# Patient Record
Sex: Female | Born: 1970 | Race: White | Hispanic: No | Marital: Married | Smoking: Never smoker
Health system: Southern US, Community
[De-identification: ages and names within clinical notes are randomized; demographics above are authoritative.]

## PROBLEM LIST (undated history)

## (undated) DIAGNOSIS — N83209 Unspecified ovarian cyst, unspecified side: Secondary | ICD-10-CM

## (undated) DIAGNOSIS — N200 Calculus of kidney: Secondary | ICD-10-CM

## (undated) DIAGNOSIS — F329 Major depressive disorder, single episode, unspecified: Secondary | ICD-10-CM

## (undated) DIAGNOSIS — E876 Hypokalemia: Secondary | ICD-10-CM

## (undated) DIAGNOSIS — C569 Malignant neoplasm of unspecified ovary: Secondary | ICD-10-CM

## (undated) DIAGNOSIS — E785 Hyperlipidemia, unspecified: Secondary | ICD-10-CM

## (undated) DIAGNOSIS — F32A Depression, unspecified: Secondary | ICD-10-CM

## (undated) HISTORY — PX: LITHOTRIPSY: SUR834

## (undated) HISTORY — PX: ABDOMINAL SURGERY: SHX537

## (undated) HISTORY — PX: KIDNEY SURGERY: SHX687

---

## 2009-09-08 ENCOUNTER — Emergency Department (HOSPITAL_BASED_OUTPATIENT_CLINIC_OR_DEPARTMENT_OTHER): Admission: EM | Admit: 2009-09-08 | Discharge: 2009-09-08 | Payer: Self-pay | Admitting: Emergency Medicine

## 2010-12-20 LAB — BASIC METABOLIC PANEL
BUN: 20 mg/dL (ref 6–23)
CO2: 23 mEq/L (ref 19–32)
Calcium: 8.8 mg/dL (ref 8.4–10.5)
Chloride: 103 mEq/L (ref 96–112)
Creatinine, Ser: 0.7 mg/dL (ref 0.4–1.2)
GFR calc Af Amer: >60 mL/min (ref >60)
GFR calc non Af Amer: >60 mL/min (ref >60)
Glucose, Bld: 202 mg/dL — ABNORMAL HIGH (ref 70–99)
Potassium: 3.7 mEq/L (ref 3.5–5.1)
Sodium: 141 mEq/L (ref 135–145)

## 2012-04-29 ENCOUNTER — Emergency Department (INDEPENDENT_AMBULATORY_CARE_PROVIDER_SITE_OTHER): Payer: 59

## 2012-04-29 ENCOUNTER — Emergency Department
Admission: EM | Admit: 2012-04-29 | Discharge: 2012-04-29 | Disposition: A | Discharge status: Home / Self Care | Payer: 59 | Source: Home / Self Care

## 2012-04-29 DIAGNOSIS — M25579 Pain in unspecified ankle and joints of unspecified foot: Secondary | ICD-10-CM

## 2012-04-29 DIAGNOSIS — M79609 Pain in unspecified limb: Secondary | ICD-10-CM

## 2012-04-29 DIAGNOSIS — S93409A Sprain of unspecified ligament of unspecified ankle, initial encounter: Secondary | ICD-10-CM

## 2012-04-29 DIAGNOSIS — M7989 Other specified soft tissue disorders: Secondary | ICD-10-CM

## 2012-04-29 HISTORY — DX: Depression, unspecified: F32.A

## 2012-04-29 HISTORY — DX: Hyperlipidemia, unspecified: E78.5

## 2012-04-29 HISTORY — DX: Major depressive disorder, single episode, unspecified: F32.9

## 2012-04-29 HISTORY — DX: Calculus of kidney: N20.0

## 2012-04-29 NOTE — ED Provider Notes (Signed)
History     CSN: [id number]  Arrival date & time 04/29/12  1111   First MD Initiated Contact with Patient 04/29/12 1116      Chief Complaint  Patient presents with  . Foot Pain   Patient is a 41 y.o. female presenting with lower extremity pain. The history is provided by the patient.  Foot Pain This is a new problem. The current episode started yesterday (Pt was walking and accidentally rolled ankle). The problem occurs constantly. The symptoms are aggravated by walking (plantar flexion ). The symptoms are relieved by ice, NSAIDs and rest.    Past Medical History  Diagnosis Date  . Diabetes mellitus   . Kidney stones   . Hyperlipidemia   . Depression     Past Surgical History  Procedure Date  . Lithotripsy     91- kidney stone sx    Family History  Problem Relation Age of Onset  . Atrial fibrillation Mother     History  Substance Use Topics  . Smoking status: Never Smoker   . Smokeless tobacco: Not on file  . Alcohol Use: No    OB History    Grav Para Term Preterm Abortions TAB SAB Ect Mult Living                  Review of Systems  All other systems reviewed and are negative.    Allergies  Morphine and related  Home Medications   Current Outpatient Rx  Name Route Sig Dispense Refill  . ASPIRIN 81 MG PO TABS Oral Take 81 mg by mouth daily.    Marland Kitchen CITALOPRAM HYDROBROMIDE 20 MG PO TABS Oral Take 20 mg by mouth daily.    Marland Kitchen CITALOPRAM HYDROBROMIDE 40 MG PO TABS Oral Take 40 mg by mouth daily.    Marland Kitchen GLIPIZIDE 5 MG PO TABS Oral Take 5 mg by mouth 2 (two) times daily before a meal.    . INDAPAMIDE 2.5 MG PO TABS Oral Take 2.5 mg by mouth every morning.    Marland Kitchen LISINOPRIL 5 MG PO TABS Oral Take 5 mg by mouth daily.    Marland Kitchen METFORMIN HCL 1000 MG PO TABS Oral Take 1,000 mg by mouth 2 (two) times daily with a meal.      BP 118/76  Pulse 67  Temp 98.2 F (36.8 C) (Oral)  Resp 18  Ht 5\' 5"  (1.651 m)  Wt 226 lb (102.513 kg)  BMI 37.61 kg/m2  SpO2 98%  LMP  04/19/2012  Physical Exam  Constitutional: She appears well-developed and well-nourished.  HENT:  Head: Normocephalic and atraumatic.  Eyes: Conjunctivae are normal. Pupils are equal, round, and reactive to light.  Neck: Normal range of motion. Neck supple.  Cardiovascular: Normal rate and regular rhythm.   Pulmonary/Chest: Effort normal and breath sounds normal.  Abdominal: Soft.  Musculoskeletal:       Feet:    ED Course  Procedures (including critical care time)  Labs Reviewed - No data to display No results found.   1. Ankle sprain       MDM  Ankle brace placed.  RICE and NSAIDs.  Discussed general red flags for reevaluation.  Follow up as needed.     The patient and/or caregiver has been counseled thoroughly with regard to treatment plan and/or medications prescribed including dosage, schedule, interactions, rationale for use, and possible side effects and they verbalize understanding. Diagnoses and expected course of recovery discussed and will return if not improved as expected  or if the condition worsens. Patient and/or caregiver verbalized understanding.             Floydene Flock, MD 04/29/12 1300

## 2012-04-29 NOTE — ED Notes (Signed)
Hit left foot on Friday and heard a pop, now having pain to top and bottom of foot.

## 2012-05-02 NOTE — ED Provider Notes (Signed)
Agree with exam, assessment, and plan.   Lattie Haw, MD 05/02/12 [phone]

## 2013-04-26 ENCOUNTER — Encounter (HOSPITAL_BASED_OUTPATIENT_CLINIC_OR_DEPARTMENT_OTHER): Payer: Self-pay | Admitting: *Deleted

## 2013-04-26 ENCOUNTER — Emergency Department (HOSPITAL_BASED_OUTPATIENT_CLINIC_OR_DEPARTMENT_OTHER)
Admission: EM | Admit: 2013-04-26 | Discharge: 2013-04-26 | Disposition: A | Discharge status: Home / Self Care | Payer: Managed Care, Other (non HMO) | Attending: Emergency Medicine | Admitting: Emergency Medicine

## 2013-04-26 DIAGNOSIS — E119 Type 2 diabetes mellitus without complications: Secondary | ICD-10-CM | POA: Insufficient documentation

## 2013-04-26 DIAGNOSIS — Z87442 Personal history of urinary calculi: Secondary | ICD-10-CM | POA: Insufficient documentation

## 2013-04-26 DIAGNOSIS — T368X5A Adverse effect of other systemic antibiotics, initial encounter: Secondary | ICD-10-CM | POA: Insufficient documentation

## 2013-04-26 DIAGNOSIS — R350 Frequency of micturition: Secondary | ICD-10-CM | POA: Insufficient documentation

## 2013-04-26 DIAGNOSIS — F3289 Other specified depressive episodes: Secondary | ICD-10-CM | POA: Insufficient documentation

## 2013-04-26 DIAGNOSIS — Z8639 Personal history of other endocrine, nutritional and metabolic disease: Secondary | ICD-10-CM | POA: Insufficient documentation

## 2013-04-26 DIAGNOSIS — Z3202 Encounter for pregnancy test, result negative: Secondary | ICD-10-CM | POA: Insufficient documentation

## 2013-04-26 DIAGNOSIS — F329 Major depressive disorder, single episode, unspecified: Secondary | ICD-10-CM | POA: Insufficient documentation

## 2013-04-26 DIAGNOSIS — Z862 Personal history of diseases of the blood and blood-forming organs and certain disorders involving the immune mechanism: Secondary | ICD-10-CM | POA: Insufficient documentation

## 2013-04-26 DIAGNOSIS — Z7982 Long term (current) use of aspirin: Secondary | ICD-10-CM | POA: Insufficient documentation

## 2013-04-26 DIAGNOSIS — N390 Urinary tract infection, site not specified: Secondary | ICD-10-CM | POA: Insufficient documentation

## 2013-04-26 DIAGNOSIS — R3911 Hesitancy of micturition: Secondary | ICD-10-CM | POA: Insufficient documentation

## 2013-04-26 DIAGNOSIS — Z79899 Other long term (current) drug therapy: Secondary | ICD-10-CM | POA: Insufficient documentation

## 2013-04-26 DIAGNOSIS — T7840XA Allergy, unspecified, initial encounter: Secondary | ICD-10-CM

## 2013-04-26 LAB — URINALYSIS, ROUTINE W REFLEX MICROSCOPIC
Bilirubin Urine: NEGATIVE
Glucose, UA: NEGATIVE mg/dL
Ketones, ur: NEGATIVE mg/dL
Nitrite: NEGATIVE
Protein, ur: NEGATIVE mg/dL
Specific Gravity, Urine: 1.016 (ref 1.005–1.030)
Urobilinogen, UA: 0.2 mg/dL (ref 0.0–1.0)
pH: 6.5 (ref 5.0–8.0)

## 2013-04-26 LAB — URINE MICROSCOPIC-ADD ON

## 2013-04-26 LAB — PREGNANCY, URINE: Preg Test, Ur: NEGATIVE

## 2013-04-26 MED ORDER — PREDNISONE 20 MG PO TABS: ORAL_TABLET | ORAL | Status: DC

## 2013-04-26 MED ORDER — DIPHENHYDRAMINE HCL 50 MG/ML IJ SOLN
25.0000 mg | Freq: Once | INTRAMUSCULAR | Status: AC
  Administered 2013-04-26: 25 mg via INTRAMUSCULAR
  Filled 2013-04-26: qty 1

## 2013-04-26 MED ORDER — FAMOTIDINE 20 MG PO TABS: 20.0000 mg | ORAL_TABLET | Freq: Two times a day (BID) | ORAL | Status: DC

## 2013-04-26 MED ORDER — DEXAMETHASONE SODIUM PHOSPHATE 10 MG/ML IJ SOLN
10.0000 mg | Freq: Once | INTRAMUSCULAR | Status: AC
  Administered 2013-04-26: 10 mg via INTRAMUSCULAR
  Filled 2013-04-26: qty 1

## 2013-04-26 MED ORDER — NITROFURANTOIN MONOHYD MACRO 100 MG PO CAPS: 100.0000 mg | ORAL_CAPSULE | Freq: Two times a day (BID) | ORAL | Status: DC

## 2013-04-26 MED ORDER — FAMOTIDINE 20 MG PO TABS
40.0000 mg | ORAL_TABLET | Freq: Once | ORAL | Status: AC
  Administered 2013-04-26: 40 mg via ORAL
  Filled 2013-04-26: qty 2

## 2013-04-26 MED ORDER — LORATADINE 10 MG PO TABS
10.0000 mg | ORAL_TABLET | Freq: Once | ORAL | Status: DC
  Filled 2013-04-26: qty 1

## 2013-04-26 NOTE — ED Notes (Signed)
Rx x 3 for macrobid, prednisone and pepcid. Pt's husband is picking her up

## 2013-04-26 NOTE — ED Notes (Signed)
MD at bedside. 

## 2013-04-26 NOTE — ED Provider Notes (Signed)
CSN: [id number]     Arrival date & time 04/26/13  6440 History     First MD Initiated Contact with Patient 04/26/13 [phone]     Chief Complaint  Patient presents with  . Dysuria   (Consider location/radiation/quality/duration/timing/severity/associated sxs/prior Treatment) Patient is a 42 y.o. female presenting with dysuria. The history is provided by the patient.  Dysuria Pain quality:  Burning Pain severity:  Moderate Onset quality:  Sudden Duration:  12 hours Timing:  Constant Progression:  Unchanged Chronicity:  Recurrent Recent urinary tract infections: yes   Relieved by:  Nothing Worsened by:  Nothing tried Ineffective treatments: old bactrim. Urinary symptoms: frequent urination and hesitancy   Associated symptoms: no abdominal pain and no flank pain   Risk factors: recurrent urinary tract infections   Also having itching in the mouth since taking the old bactrim tablets.  No swelling of the lips tongue or uvula.  No rashes on the skin no SOB  Past Medical History  Diagnosis Date  . Diabetes mellitus   . Kidney stones   . Hyperlipidemia   . Depression    Past Surgical History  Procedure Laterality Date  . Lithotripsy      89- kidney stone sx   Family History  Problem Relation Age of Onset  . Atrial fibrillation Mother    History  Substance Use Topics  . Smoking status: Never Smoker   . Smokeless tobacco: Not on file  . Alcohol Use: No   OB History   Grav Para Term Preterm Abortions TAB SAB Ect Mult Living                 Review of Systems  HENT: Negative for sore throat, drooling, mouth sores, trouble swallowing and voice change.   Gastrointestinal: Negative for abdominal pain.  Genitourinary: Positive for dysuria. Negative for flank pain.  Skin: Negative for rash.  All other systems reviewed and are negative.    Allergies  Morphine and related  Home Medications   Current Outpatient Rx  Name  Route  Sig  Dispense  Refill  . aspirin 81 MG  tablet   Oral   Take 81 mg by mouth daily.         . citalopram (CELEXA) 20 MG tablet   Oral   Take 20 mg by mouth daily.         . citalopram (CELEXA) 40 MG tablet   Oral   Take 40 mg by mouth daily.         Marland Kitchen glipiZIDE (GLUCOTROL) 5 MG tablet   Oral   Take 5 mg by mouth 2 (two) times daily before a meal.         . indapamide (LOZOL) 2.5 MG tablet   Oral   Take 2.5 mg by mouth every morning.         Marland Kitchen lisinopril (PRINIVIL,ZESTRIL) 5 MG tablet   Oral   Take 5 mg by mouth daily.         . metFORMIN (GLUCOPHAGE) 1000 MG tablet   Oral   Take 1,000 mg by mouth 2 (two) times daily with a meal.          BP 132/85  Pulse 74  Temp(Src) 98.3 F (36.8 C) (Oral)  Resp 18  Ht 5\' 5"  (1.651 m)  Wt 212 lb (96.163 kg)  BMI 35.28 kg/m2  SpO2 98%  LMP 04/19/2013 Physical Exam  Constitutional: She is oriented to person, place, and time. She appears well-developed and well-nourished.  No distress.  HENT:  Head: Normocephalic and atraumatic.  Mouth/Throat: Oropharynx is clear and moist.  No swelling of the lips tongue or uvula  Eyes: Conjunctivae are normal.  Neck: Normal range of motion. Neck supple.  Cardiovascular: Normal rate and regular rhythm.   Pulmonary/Chest: Effort normal and breath sounds normal. No stridor. She has no wheezes. She has no rales.  Abdominal: Soft. Bowel sounds are normal. There is no tenderness. There is no rebound and no guarding.  Musculoskeletal: Normal range of motion.  Neurological: She is alert and oriented to person, place, and time.  Skin: Skin is warm and dry. No rash noted.  Psychiatric: She has a normal mood and affect.    ED Course   Procedures (including critical care time)  Labs Reviewed  URINALYSIS, ROUTINE W REFLEX MICROSCOPIC  PREGNANCY, URINE   No results found. No diagnosis found.  MDM  Patient markedly improved post medication.  Discontinue bactrim list as allergy.  Will prescribe steroids and pepcid.  Will  also prescribe macrobid.    Jasmine Awe, MD 04/26/13 [phone]

## 2013-04-26 NOTE — ED Notes (Signed)
States believes she has UTI so took a bactrim- after taking bactrim she began having itching and tingling in her mouth

## 2013-04-28 LAB — URINE CULTURE: Colony Count: 80000

## 2013-04-29 NOTE — ED Notes (Signed)
Post ED Visit - Positive Culture Follow-up: Successful Patient Follow-Up  Culture assessed and recommendations reviewed by: []  Wes Dulaney, Pharm.D., BCPS [x]  Celedonio Miyamoto, Pharm.D., BCPS []  Georgina Pillion, Pharm.D., BCPS []  [place], [address].D., BCPS, AAHIVP []  Estella Husk, Pharm.D., BCPS, AAHIVP  Positive urine culture  []  Patient discharged without antimicrobial prescription and treatment is now indicated [x]  Organism is resistant to prescribed ED discharge antimicrobial []  Patient with positive blood cultures  Changes discussed with ED provider:  Sharilyn Sites New antibiotic prescription Stop  Macrobid -start Cipro 500 mg po BID x 3 days    Larena Sox 04/29/2013, 6:38 PM

## 2013-04-29 NOTE — Progress Notes (Signed)
ED Antimicrobial Stewardship Positive Culture Follow Up   Catherine Marshall is an 42 y.o. female who presented to [facility] on 04/26/2013 with a chief complaint of  Chief Complaint  Patient presents with  . Dysuria  . Allergic Reaction    Recent Results (from the past 720 hour(s))  URINE CULTURE     Status: None   Collection Time    04/26/13  5:22 AM      Result Value Range Status   Specimen Description URINE, CLEAN CATCH   Final   Special Requests NONE   Final   Culture  Setup Time     Final   Value: 04/26/2013 09:46     Performed at [facility] Count     Final   Value: 80,000 COLONIES/ML     Performed at [facility]   Culture     Final   Value: ENTEROBACTER AEROGENES     Performed at [facility]   Report Status 04/28/2013 FINAL   Final   Organism ID, Bacteria ENTEROBACTER AEROGENES   Final    [x]  Treated with Macrobid, organism resistant to prescribed antimicrobial  New antibiotic prescription: ciprofloxacin 500mg  BID x 3 days  ED Provider: Sharilyn Sites PAC   Mickeal Skinner 04/29/2013, 9:23 AM Infectious Diseases Pharmacist Phone# [phone]

## 2013-04-30 ENCOUNTER — Telehealth (HOSPITAL_COMMUNITY): Payer: Self-pay | Admitting: *Deleted

## 2013-05-22 ENCOUNTER — Encounter (HOSPITAL_BASED_OUTPATIENT_CLINIC_OR_DEPARTMENT_OTHER): Payer: Self-pay | Admitting: Emergency Medicine

## 2013-05-22 ENCOUNTER — Emergency Department (HOSPITAL_BASED_OUTPATIENT_CLINIC_OR_DEPARTMENT_OTHER): Payer: Managed Care, Other (non HMO)

## 2013-05-22 ENCOUNTER — Emergency Department (HOSPITAL_BASED_OUTPATIENT_CLINIC_OR_DEPARTMENT_OTHER)
Admission: EM | Admit: 2013-05-22 | Discharge: 2013-05-22 | Disposition: A | Discharge status: Home / Self Care | Payer: Managed Care, Other (non HMO) | Attending: Emergency Medicine | Admitting: Emergency Medicine

## 2013-05-22 DIAGNOSIS — Z79899 Other long term (current) drug therapy: Secondary | ICD-10-CM | POA: Insufficient documentation

## 2013-05-22 DIAGNOSIS — Z87442 Personal history of urinary calculi: Secondary | ICD-10-CM | POA: Insufficient documentation

## 2013-05-22 DIAGNOSIS — Z3202 Encounter for pregnancy test, result negative: Secondary | ICD-10-CM | POA: Insufficient documentation

## 2013-05-22 DIAGNOSIS — Z7982 Long term (current) use of aspirin: Secondary | ICD-10-CM | POA: Insufficient documentation

## 2013-05-22 DIAGNOSIS — R509 Fever, unspecified: Secondary | ICD-10-CM | POA: Insufficient documentation

## 2013-05-22 DIAGNOSIS — F329 Major depressive disorder, single episode, unspecified: Secondary | ICD-10-CM | POA: Insufficient documentation

## 2013-05-22 DIAGNOSIS — Z8639 Personal history of other endocrine, nutritional and metabolic disease: Secondary | ICD-10-CM | POA: Insufficient documentation

## 2013-05-22 DIAGNOSIS — R1011 Right upper quadrant pain: Secondary | ICD-10-CM | POA: Insufficient documentation

## 2013-05-22 DIAGNOSIS — R1084 Generalized abdominal pain: Secondary | ICD-10-CM | POA: Insufficient documentation

## 2013-05-22 DIAGNOSIS — N390 Urinary tract infection, site not specified: Secondary | ICD-10-CM

## 2013-05-22 DIAGNOSIS — E119 Type 2 diabetes mellitus without complications: Secondary | ICD-10-CM | POA: Insufficient documentation

## 2013-05-22 DIAGNOSIS — Z862 Personal history of diseases of the blood and blood-forming organs and certain disorders involving the immune mechanism: Secondary | ICD-10-CM | POA: Insufficient documentation

## 2013-05-22 DIAGNOSIS — F3289 Other specified depressive episodes: Secondary | ICD-10-CM | POA: Insufficient documentation

## 2013-05-22 LAB — CBC WITH DIFFERENTIAL/PLATELET
Basophils Absolute: 0 10*3/uL (ref 0.0–0.1)
Basophils Relative: 0 % (ref 0–1)
Eosinophils Absolute: 0.1 10*3/uL (ref 0.0–0.7)
Eosinophils Relative: 1 % (ref 0–5)
HCT: 45.7 % (ref 36.0–46.0)
Hemoglobin: 16 g/dL — ABNORMAL HIGH (ref 12.0–15.0)
Lymphocytes Relative: 12 % (ref 12–46)
Lymphs Abs: 1.8 10*3/uL (ref 0.7–4.0)
MCH: 31.1 pg (ref 26.0–34.0)
MCHC: 35 g/dL (ref 30.0–36.0)
MCV: 88.7 fL (ref 78.0–100.0)
Monocytes Absolute: 1.2 10*3/uL — ABNORMAL HIGH (ref 0.1–1.0)
Monocytes Relative: 8 % (ref 3–12)
Neutro Abs: 12.4 10*3/uL — ABNORMAL HIGH (ref 1.7–7.7)
Neutrophils Relative %: 80 % — ABNORMAL HIGH (ref 43–77)
Platelets: 277 10*3/uL (ref 150–400)
RBC: 5.15 MIL/uL — ABNORMAL HIGH (ref 3.87–5.11)
RDW: 13.2 % (ref 11.5–15.5)
WBC: 15.6 10*3/uL — ABNORMAL HIGH (ref 4.0–10.5)

## 2013-05-22 LAB — URINE MICROSCOPIC-ADD ON

## 2013-05-22 LAB — BASIC METABOLIC PANEL
BUN: 14 mg/dL (ref 6–23)
CO2: 29 mEq/L (ref 19–32)
Calcium: 9.9 mg/dL (ref 8.4–10.5)
Chloride: 97 mEq/L (ref 96–112)
Creatinine, Ser: 1 mg/dL (ref 0.50–1.10)
GFR calc Af Amer: 80 mL/min — ABNORMAL LOW (ref >90)
GFR calc non Af Amer: 69 mL/min — ABNORMAL LOW (ref >90)
Glucose, Bld: 156 mg/dL — ABNORMAL HIGH (ref 70–99)
Potassium: 3.5 mEq/L (ref 3.5–5.1)
Sodium: 137 mEq/L (ref 135–145)

## 2013-05-22 LAB — URINALYSIS, ROUTINE W REFLEX MICROSCOPIC
Bilirubin Urine: NEGATIVE
Glucose, UA: NEGATIVE mg/dL
Ketones, ur: NEGATIVE mg/dL
Nitrite: POSITIVE — AB
Protein, ur: NEGATIVE mg/dL
Specific Gravity, Urine: 1.014 (ref 1.005–1.030)
Urobilinogen, UA: 0.2 mg/dL (ref 0.0–1.0)
pH: 6 (ref 5.0–8.0)

## 2013-05-22 LAB — LIPASE, BLOOD: Lipase: 39 U/L (ref 11–59)

## 2013-05-22 LAB — HEPATIC FUNCTION PANEL
ALT: 22 U/L (ref 0–35)
AST: 14 U/L (ref 0–37)
Albumin: 4.5 g/dL (ref 3.5–5.2)
Alkaline Phosphatase: 46 U/L (ref 39–117)
Bilirubin, Direct: 0.2 mg/dL (ref 0.0–0.3)
Indirect Bilirubin: 1 mg/dL — ABNORMAL HIGH (ref 0.3–0.9)
Total Bilirubin: 1.2 mg/dL (ref 0.3–1.2)
Total Protein: 7.6 g/dL (ref 6.0–8.3)

## 2013-05-22 LAB — PREGNANCY, URINE: Preg Test, Ur: NEGATIVE

## 2013-05-22 MED ORDER — DEXTROSE 5 % IV SOLN
1.0000 g | INTRAVENOUS | Status: DC
  Administered 2013-05-22 (×2): 1 g via INTRAVENOUS
  Filled 2013-05-22: qty 10

## 2013-05-22 MED ORDER — ACETAMINOPHEN 325 MG PO TABS
ORAL_TABLET | ORAL | Status: AC
  Filled 2013-05-22: qty 2

## 2013-05-22 MED ORDER — HYDROCODONE-ACETAMINOPHEN 5-325 MG PO TABS: 2.0000 | ORAL_TABLET | ORAL | Status: DC | PRN

## 2013-05-22 MED ORDER — ONDANSETRON HCL 4 MG/2ML IJ SOLN
4.0000 mg | Freq: Once | INTRAMUSCULAR | Status: AC
  Administered 2013-05-22: 4 mg via INTRAVENOUS

## 2013-05-22 MED ORDER — CEPHALEXIN 500 MG PO CAPS: 500.0000 mg | ORAL_CAPSULE | Freq: Four times a day (QID) | ORAL | Status: DC

## 2013-05-22 MED ORDER — HYDROMORPHONE HCL PF 1 MG/ML IJ SOLN
1.0000 mg | Freq: Once | INTRAMUSCULAR | Status: AC
  Administered 2013-05-22: 1 mg via INTRAVENOUS

## 2013-05-22 MED ORDER — CEFTRIAXONE SODIUM 1 G IJ SOLR: 1.0000 g | Freq: Once | INTRAMUSCULAR | Status: DC

## 2013-05-22 MED ORDER — HYDROMORPHONE HCL PF 1 MG/ML IJ SOLN
INTRAMUSCULAR | Status: AC
  Filled 2013-05-22: qty 1

## 2013-05-22 MED ORDER — ACETAMINOPHEN 325 MG PO TABS
650.0000 mg | ORAL_TABLET | Freq: Four times a day (QID) | ORAL | Status: DC | PRN
  Administered 2013-05-22: 650 mg via ORAL

## 2013-05-22 MED ORDER — ONDANSETRON HCL 4 MG/2ML IJ SOLN
INTRAMUSCULAR | Status: AC
  Filled 2013-05-22: qty 2

## 2013-05-22 NOTE — ED Notes (Signed)
Pa  at bedside. 

## 2013-05-22 NOTE — ED Notes (Signed)
RLQ pain and right flank pain since yesterday afternoon.  Fever to 102.5 temp earlier this afternoon which has responded to ibuprofen. Sts she has had 97 surgeries for kidney stones.

## 2013-05-22 NOTE — ED Provider Notes (Signed)
Medical screening examination/treatment/procedure(s) were performed by non-physician practitioner and as supervising physician I was immediately available for consultation/collaboration.   Delena Casebeer B. Eward Rutigliano, MD 05/22/13 2304 

## 2013-05-22 NOTE — ED Provider Notes (Signed)
CSN: [id number]     Arrival date & time 05/22/13  1713 History   First MD Initiated Contact with Patient 05/22/13 1728     Chief Complaint  Patient presents with  . Abdominal Pain  . Flank Pain  . Fever   (Consider location/radiation/quality/duration/timing/severity/associated sxs/prior Treatment) Patient is a 42 y.o. female presenting with abdominal pain. The history is provided by the patient. No language interpreter was used.  Abdominal Pain Pain location:  Generalized Pain quality: aching   Pain radiates to:  RUQ and R flank Pain severity:  Moderate Duration:  1 day Timing:  Constant Progression:  Worsening Chronicity:  New Relieved by:  Nothing Worsened by:  Nothing tried Associated symptoms: chills   Associated symptoms: no vomiting   Pt complains of pain in right flank.   Pt has a history of kidney stones,  (over a hundred)   Pt reports she began having pain yesterday,  Pain feels like a kidney stone.    Past Medical History  Diagnosis Date  . Diabetes mellitus   . Kidney stones   . Hyperlipidemia   . Depression    Past Surgical History  Procedure Laterality Date  . Lithotripsy      89- kidney stone sx   Family History  Problem Relation Age of Onset  . Atrial fibrillation Mother    History  Substance Use Topics  . Smoking status: Never Smoker   . Smokeless tobacco: Not on file  . Alcohol Use: No   OB History   Grav Para Term Preterm Abortions TAB SAB Ect Mult Living                 Review of Systems  Constitutional: Positive for chills.  Gastrointestinal: Positive for abdominal pain. Negative for vomiting.  All other systems reviewed and are negative.    Allergies  Bactrim and Morphine and related  Home Medications   Current Outpatient Rx  Name  Route  Sig  Dispense  Refill  . sitaGLIPtin-metformin (JANUMET) 50-1000 MG per tablet   Oral   Take 1 tablet by mouth 2 (two) times daily with a meal.         . aspirin 81 MG tablet   Oral    Take 81 mg by mouth daily.         Marland Kitchen buPROPion (WELLBUTRIN XL) 150 MG 24 hr tablet   Oral   Take 150 mg by mouth daily.         . citalopram (CELEXA) 20 MG tablet   Oral   Take 20 mg by mouth daily.         . citalopram (CELEXA) 40 MG tablet   Oral   Take 40 mg by mouth daily.         . famotidine (PEPCID) 20 MG tablet   Oral   Take 1 tablet (20 mg total) by mouth 2 (two) times daily.   10 tablet   0   . glipiZIDE (GLUCOTROL) 5 MG tablet   Oral   Take 5 mg by mouth 2 (two) times daily before a meal.         . indapamide (LOZOL) 2.5 MG tablet   Oral   Take 5 mg by mouth daily.          Marland Kitchen lisinopril (PRINIVIL,ZESTRIL) 5 MG tablet   Oral   Take 10 mg by mouth daily.          . metFORMIN (GLUCOPHAGE) 1000 MG tablet  Oral   Take 1,000 mg by mouth 2 (two) times daily with a meal.         . MetFORMIN HCl (GLUCOPHAGE XR PO)   Oral   Take 1,000 mg by mouth every morning.         . nitrofurantoin, macrocrystal-monohydrate, (MACROBID) 100 MG capsule   Oral   Take 1 capsule (100 mg total) by mouth 2 (two) times daily. X 7 days   14 capsule   0   . pantoprazole (PROTONIX) 40 MG tablet   Oral   Take 40 mg by mouth daily.         . potassium citrate (UROCIT-K) 10 MEQ (1080 MG) SR tablet   Oral   Take 10 mEq by mouth 3 (three) times daily with meals.         . predniSONE (DELTASONE) 20 MG tablet      2 tabs po daily x 3 days   6 tablet   0    BP 114/77  Pulse 117  Temp(Src) 100.3 F (37.9 C) (Oral)  Resp 20  Ht 5\' 5"  (1.651 m)  Wt 209 lb (94.802 kg)  BMI 34.78 kg/m2  SpO2 100%  LMP 05/14/2013 Physical Exam  Nursing note and vitals reviewed. Constitutional: She appears well-developed and well-nourished.  HENT:  Head: Normocephalic.  Right Ear: External ear normal.  Left Ear: External ear normal.  Nose: Nose normal.  Mouth/Throat: Oropharynx is clear and moist.  Eyes: Pupils are equal, round, and reactive to light.  Neck: Normal  range of motion.  Cardiovascular: Normal heart sounds.   Pulmonary/Chest: Effort normal and breath sounds normal.  Abdominal: Soft. There is tenderness.  Musculoskeletal: Normal range of motion.  Neurological: She is alert.  Skin: Skin is warm.  Psychiatric: She has a normal mood and affect.    ED Course  Procedures (including critical care time) Labs Review Labs Reviewed  URINALYSIS, ROUTINE W REFLEX MICROSCOPIC - Abnormal; Notable for the following:    APPearance CLOUDY (*)    Hgb urine dipstick LARGE (*)    Nitrite POSITIVE (*)    Leukocytes, UA LARGE (*)    All other components within normal limits  URINE MICROSCOPIC-ADD ON - Abnormal; Notable for the following:    Squamous Epithelial / LPF FEW (*)    Bacteria, UA MANY (*)    All other components within normal limits  URINE CULTURE  PREGNANCY, URINE   Imaging Review Ct Abdomen Pelvis Wo Contrast  05/22/2013   *RADIOLOGY REPORT*  Clinical Data: Right flank pain with nausea and hematuria.  History of kidney stones.  CT ABDOMEN AND PELVIS WITHOUT CONTRAST  Technique:  Multidetector CT imaging of the abdomen and pelvis was performed following the standard protocol without intravenous contrast.  Comparison: None.  Findings: No focal abnormalities seen in the liver or spleen on this study performed without intravenous contrast material.  The stomach, duodenum, pancreas, gallbladder, and adrenal glands are unremarkable.  Approximately seven nonobstructing stones are seen in the right kidney.  Most of these are punctate in size.  The largest stone is in the lower pole measures 5 mm.  On the left, about eight stones are visualized in the largest is in the interpolar region measuring 3 x 7 mm.  No evidence for hydronephrosis in the left kidney.  No ureteral stones on either side.  No evidence for bladder stones. No secondary changes in either kidney or ureter.  No abdominal aortic aneurysm.  There is no free  fluid or lymphadenopathy in the  abdomen.  Imaging through the pelvis shows no free intraperitoneal fluid. There is no pelvic sidewall lymphadenopathy.  Bladder is normal. 2.7 cm cystic process identified in the left ovary.  No right adnexal mass.  No colonic diverticulitis.  Terminal ileum is normal.  The appendix is normal.  Bone windows reveal no worrisome lytic or sclerotic osseous lesions.  IMPRESSION: Numerous bilateral nonobstructing renal stones.  No evidence for ureteral or bladder stones.  No secondary changes in either kidney or ureter.   Original Report Authenticated By: Kennith Center, M.D.    MDM  No diagnosis found. Results for orders placed during the hospital encounter of 05/22/13  URINALYSIS, ROUTINE W REFLEX MICROSCOPIC      Result Value Range   Color, Urine YELLOW  YELLOW   APPearance CLOUDY (*) CLEAR   Specific Gravity, Urine 1.014  1.005 - 1.030   pH 6.0  5.0 - 8.0   Glucose, UA NEGATIVE  NEGATIVE mg/dL   Hgb urine dipstick LARGE (*) NEGATIVE   Bilirubin Urine NEGATIVE  NEGATIVE   Ketones, ur NEGATIVE  NEGATIVE mg/dL   Protein, ur NEGATIVE  NEGATIVE mg/dL   Urobilinogen, UA 0.2  0.0 - 1.0 mg/dL   Nitrite POSITIVE (*) NEGATIVE   Leukocytes, UA LARGE (*) NEGATIVE  PREGNANCY, URINE      Result Value Range   Preg Test, Ur NEGATIVE  NEGATIVE  URINE MICROSCOPIC-ADD ON      Result Value Range   Squamous Epithelial / LPF FEW (*) RARE   WBC, UA 21-50  <3 WBC/hpf   RBC / HPF 21-50  <3 RBC/hpf   Bacteria, UA MANY (*) RARE   Urine-Other MUCOUS PRESENT    CBC WITH DIFFERENTIAL      Result Value Range   WBC 15.6 (*) 4.0 - 10.5 K/uL   RBC 5.15 (*) 3.87 - 5.11 MIL/uL   Hemoglobin 16.0 (*) 12.0 - 15.0 g/dL   HCT 40.9  81.1 - 91.4 %   MCV 88.7  78.0 - 100.0 fL   MCH 31.1  26.0 - 34.0 pg   MCHC 35.0  30.0 - 36.0 g/dL   RDW 78.2  95.6 - 21.3 %   Platelets 277  150 - 400 K/uL   Neutrophils Relative % 80 (*) 43 - 77 %   Neutro Abs 12.4 (*) 1.7 - 7.7 K/uL   Lymphocytes Relative 12  12 - 46 %   Lymphs Abs 1.8   0.7 - 4.0 K/uL   Monocytes Relative 8  3 - 12 %   Monocytes Absolute 1.2 (*) 0.1 - 1.0 K/uL   Eosinophils Relative 1  0 - 5 %   Eosinophils Absolute 0.1  0.0 - 0.7 K/uL   Basophils Relative 0  0 - 1 %   Basophils Absolute 0.0  0.0 - 0.1 K/uL  BASIC METABOLIC PANEL      Result Value Range   Sodium 137  135 - 145 mEq/L   Potassium 3.5  3.5 - 5.1 mEq/L   Chloride 97  96 - 112 mEq/L   CO2 29  19 - 32 mEq/L   Glucose, Bld 156 (*) 70 - 99 mg/dL   BUN 14  6 - 23 mg/dL   Creatinine, Ser 0.86  0.50 - 1.10 mg/dL   Calcium 9.9  8.4 - 57.8 mg/dL   GFR calc non Af Amer 69 (*) >90 mL/min   GFR calc Af Amer 80 (*) >90 mL/min  HEPATIC FUNCTION  PANEL      Result Value Range   Total Protein 7.6  6.0 - 8.3 g/dL   Albumin 4.5  3.5 - 5.2 g/dL   AST 14  0 - 37 U/L   ALT 22  0 - 35 U/L   Alkaline Phosphatase 46  39 - 117 U/L   Total Bilirubin 1.2  0.3 - 1.2 mg/dL   Bilirubin, Direct 0.2  0.0 - 0.3 mg/dL   Indirect Bilirubin 1.0 (*) 0.3 - 0.9 mg/dL  LIPASE, BLOOD      Result Value Range   Lipase 39  11 - 59 U/L   Ct Abdomen Pelvis Wo Contrast  05/22/2013   *RADIOLOGY REPORT*  Clinical Data: Right flank pain with nausea and hematuria.  History of kidney stones.  CT ABDOMEN AND PELVIS WITHOUT CONTRAST  Technique:  Multidetector CT imaging of the abdomen and pelvis was performed following the standard protocol without intravenous contrast.  Comparison: None.  Findings: No focal abnormalities seen in the liver or spleen on this study performed without intravenous contrast material.  The stomach, duodenum, pancreas, gallbladder, and adrenal glands are unremarkable.  Approximately seven nonobstructing stones are seen in the right kidney.  Most of these are punctate in size.  The largest stone is in the lower pole measures 5 mm.  On the left, about eight stones are visualized in the largest is in the interpolar region measuring 3 x 7 mm.  No evidence for hydronephrosis in the left kidney.  No ureteral stones on  either side.  No evidence for bladder stones. No secondary changes in either kidney or ureter.  No abdominal aortic aneurysm.  There is no free fluid or lymphadenopathy in the abdomen.  Imaging through the pelvis shows no free intraperitoneal fluid. There is no pelvic sidewall lymphadenopathy.  Bladder is normal. 2.7 cm cystic process identified in the left ovary.  No right adnexal mass.  No colonic diverticulitis.  Terminal ileum is normal.  The appendix is normal.  Bone windows reveal no worrisome lytic or sclerotic osseous lesions.  IMPRESSION: Numerous bilateral nonobstructing renal stones.  No evidence for ureteral or bladder stones.  No secondary changes in either kidney or ureter.   Original Report Authenticated By: Kennith Center, M.D.    Pt given rocephin 1 Gram IM.   Pt advised to see her Md for recheck.   Pt given rx for keflex and Hydrocodone.   Elson Areas, PA-C 05/22/13 2104  Elson Areas, PA-C 05/22/13 2105

## 2013-05-24 LAB — URINE CULTURE: Colony Count: >=100000

## 2013-05-25 ENCOUNTER — Telehealth (HOSPITAL_COMMUNITY): Payer: Self-pay | Admitting: Emergency Medicine

## 2013-05-25 NOTE — Progress Notes (Signed)
ED Antimicrobial Stewardship Positive Culture Follow Up   Catherine Marshall is an 42 y.o. female who presented to [facility] on 05/22/2013 with a chief complaint of  Chief Complaint  Patient presents with  . Abdominal Pain  . Flank Pain  . Fever    Recent Results (from the past 720 hour(s))  URINE CULTURE     Status: None   Collection Time    04/26/13  5:22 AM      Result Value Range Status   Specimen Description URINE, CLEAN CATCH   Final   Special Requests NONE   Final   Culture  Setup Time     Final   Value: 04/26/2013 09:46     Performed at [facility] Count     Final   Value: 80,000 COLONIES/ML     Performed at [facility]   Culture     Final   Value: ENTEROBACTER AEROGENES     Performed at [facility]   Report Status 04/28/2013 FINAL   Final   Organism ID, Bacteria ENTEROBACTER AEROGENES   Final  URINE CULTURE     Status: None   Collection Time    05/22/13  5:20 PM      Result Value Range Status   Specimen Description URINE, CLEAN CATCH   Final   Special Requests NONE   Final   Culture  Setup Time     Final   Value: 05/23/2013 01:59     Performed at [facility] Count     Final   Value: >=100,000 COLONIES/ML     Performed at [facility]   Culture     Final   Value: ENTEROBACTER AEROGENES     Performed at [facility]   Report Status 05/24/2013 FINAL   Final   Organism ID, Bacteria ENTEROBACTER AEROGENES   Final    [x]  Treated with cephalexin, organism resistant to prescribed antimicrobial []  Patient discharged originally without antimicrobial agent and treatment is now indicated  New antibiotic prescription: ciprofloxacin 250mg  PO BID x 3 days  ED Provider: Charlestine Night, PA  Trejon Duford, Drake Leach 05/25/2013, 10:35 AM Clinical Pharmacist Phone# [phone]

## 2013-05-25 NOTE — ED Notes (Signed)
Post ED Visit - Positive Culture Follow-up: Successful Patient Follow-Up  Culture assessed and recommendations reviewed by: []  Wes Dulaney, Pharm.D., BCPS []  Celedonio Miyamoto, Pharm.D., BCPS []  Georgina Pillion, [address].D., BCPS []  [place], [address].D., BCPS, AAHIVP []  Estella Husk, Pharm.D., BCPS, AAHIVP [x]  Lysle Pearl, Pharm.D., BCPS  Positive urine culture  []  Patient discharged without antimicrobial prescription and treatment is now indicated [x]  Organism is resistant to prescribed ED discharge antimicrobial []  Patient with positive blood cultures  Changes discussed with ED provider: Ebbie Ridge PA-C New antibiotic prescription: Cipro 250 mg BID x 3 days    Kylie A Holland 05/25/2013, 10:58 AM

## 2014-03-27 ENCOUNTER — Emergency Department (HOSPITAL_BASED_OUTPATIENT_CLINIC_OR_DEPARTMENT_OTHER)
Admission: EM | Admit: 2014-03-27 | Discharge: 2014-03-27 | Disposition: A | Discharge status: Home / Self Care | Payer: BC Managed Care – PPO | Attending: Emergency Medicine | Admitting: Emergency Medicine

## 2014-03-27 ENCOUNTER — Encounter (HOSPITAL_BASED_OUTPATIENT_CLINIC_OR_DEPARTMENT_OTHER): Payer: Self-pay | Admitting: Emergency Medicine

## 2014-03-27 DIAGNOSIS — Z79899 Other long term (current) drug therapy: Secondary | ICD-10-CM | POA: Diagnosis not present

## 2014-03-27 DIAGNOSIS — Z794 Long term (current) use of insulin: Secondary | ICD-10-CM | POA: Diagnosis not present

## 2014-03-27 DIAGNOSIS — F411 Generalized anxiety disorder: Secondary | ICD-10-CM | POA: Insufficient documentation

## 2014-03-27 DIAGNOSIS — F329 Major depressive disorder, single episode, unspecified: Secondary | ICD-10-CM | POA: Insufficient documentation

## 2014-03-27 DIAGNOSIS — N390 Urinary tract infection, site not specified: Secondary | ICD-10-CM | POA: Diagnosis not present

## 2014-03-27 DIAGNOSIS — Z87442 Personal history of urinary calculi: Secondary | ICD-10-CM | POA: Diagnosis not present

## 2014-03-27 DIAGNOSIS — Z792 Long term (current) use of antibiotics: Secondary | ICD-10-CM | POA: Insufficient documentation

## 2014-03-27 DIAGNOSIS — E119 Type 2 diabetes mellitus without complications: Secondary | ICD-10-CM | POA: Diagnosis not present

## 2014-03-27 DIAGNOSIS — Z7982 Long term (current) use of aspirin: Secondary | ICD-10-CM | POA: Insufficient documentation

## 2014-03-27 DIAGNOSIS — F3289 Other specified depressive episodes: Secondary | ICD-10-CM | POA: Diagnosis not present

## 2014-03-27 DIAGNOSIS — E785 Hyperlipidemia, unspecified: Secondary | ICD-10-CM | POA: Insufficient documentation

## 2014-03-27 DIAGNOSIS — R5383 Other fatigue: Secondary | ICD-10-CM | POA: Diagnosis present

## 2014-03-27 DIAGNOSIS — E876 Hypokalemia: Secondary | ICD-10-CM | POA: Diagnosis not present

## 2014-03-27 DIAGNOSIS — R5381 Other malaise: Secondary | ICD-10-CM | POA: Diagnosis present

## 2014-03-27 HISTORY — DX: Hypokalemia: E87.6

## 2014-03-27 LAB — COMPREHENSIVE METABOLIC PANEL
ALT: 33 U/L (ref 0–35)
AST: 22 U/L (ref 0–37)
Albumin: 4.7 g/dL (ref 3.5–5.2)
Alkaline Phosphatase: 49 U/L (ref 39–117)
Anion gap: 21 — ABNORMAL HIGH (ref 5–15)
BUN: 24 mg/dL — ABNORMAL HIGH (ref 6–23)
CO2: 25 mEq/L (ref 19–32)
Calcium: 10.3 mg/dL (ref 8.4–10.5)
Chloride: 92 mEq/L — ABNORMAL LOW (ref 96–112)
Creatinine, Ser: 1 mg/dL (ref 0.50–1.10)
GFR calc Af Amer: 79 mL/min — ABNORMAL LOW (ref >90)
GFR calc non Af Amer: 68 mL/min — ABNORMAL LOW (ref >90)
Glucose, Bld: 194 mg/dL — ABNORMAL HIGH (ref 70–99)
Potassium: 3.3 mEq/L — ABNORMAL LOW (ref 3.7–5.3)
Sodium: 138 mEq/L (ref 137–147)
Total Bilirubin: 1 mg/dL (ref 0.3–1.2)
Total Protein: 8 g/dL (ref 6.0–8.3)

## 2014-03-27 LAB — CBC WITH DIFFERENTIAL/PLATELET
Basophils Absolute: 0.1 10*3/uL (ref 0.0–0.1)
Basophils Relative: 0 % (ref 0–1)
Eosinophils Absolute: 0.3 10*3/uL (ref 0.0–0.7)
Eosinophils Relative: 2 % (ref 0–5)
HCT: 49.5 % — ABNORMAL HIGH (ref 36.0–46.0)
Hemoglobin: 17.5 g/dL — ABNORMAL HIGH (ref 12.0–15.0)
Lymphocytes Relative: 24 % (ref 12–46)
Lymphs Abs: 3.1 10*3/uL (ref 0.7–4.0)
MCH: 31.1 pg (ref 26.0–34.0)
MCHC: 35.4 g/dL (ref 30.0–36.0)
MCV: 87.9 fL (ref 78.0–100.0)
Monocytes Absolute: 0.8 10*3/uL (ref 0.1–1.0)
Monocytes Relative: 7 % (ref 3–12)
Neutro Abs: 8.7 10*3/uL — ABNORMAL HIGH (ref 1.7–7.7)
Neutrophils Relative %: 67 % (ref 43–77)
Platelets: 276 10*3/uL (ref 150–400)
RBC: 5.63 MIL/uL — ABNORMAL HIGH (ref 3.87–5.11)
RDW: 12.9 % (ref 11.5–15.5)
WBC: 13 10*3/uL — ABNORMAL HIGH (ref 4.0–10.5)

## 2014-03-27 LAB — URINALYSIS, ROUTINE W REFLEX MICROSCOPIC
Bilirubin Urine: NEGATIVE
Glucose, UA: NEGATIVE mg/dL
Ketones, ur: 15 mg/dL — AB
Nitrite: POSITIVE — AB
Protein, ur: 30 mg/dL — AB
Specific Gravity, Urine: 1.024 (ref 1.005–1.030)
Urobilinogen, UA: 1 mg/dL (ref 0.0–1.0)
pH: 5.5 (ref 5.0–8.0)

## 2014-03-27 LAB — URINE MICROSCOPIC-ADD ON

## 2014-03-27 LAB — TROPONIN I: Troponin I: <0.3 ng/mL (ref <0.30)

## 2014-03-27 LAB — CBG MONITORING, ED: Glucose-Capillary: 180 mg/dL — ABNORMAL HIGH (ref 70–99)

## 2014-03-27 MED ORDER — CEFTRIAXONE SODIUM 1 G IJ SOLR
INTRAMUSCULAR | Status: AC
  Filled 2014-03-27: qty 10

## 2014-03-27 MED ORDER — POTASSIUM CHLORIDE CRYS ER 20 MEQ PO TBCR
40.0000 meq | EXTENDED_RELEASE_TABLET | Freq: Once | ORAL | Status: AC
  Administered 2014-03-27: 40 meq via ORAL
  Filled 2014-03-27: qty 2

## 2014-03-27 MED ORDER — SODIUM CHLORIDE 0.9 % IV BOLUS (SEPSIS)
1000.0000 mL | Freq: Once | INTRAVENOUS | Status: AC
  Administered 2014-03-27: 1000 mL via INTRAVENOUS

## 2014-03-27 MED ORDER — CEPHALEXIN 500 MG PO CAPS: 500.0000 mg | ORAL_CAPSULE | Freq: Three times a day (TID) | ORAL | Status: DC

## 2014-03-27 MED ORDER — POTASSIUM CHLORIDE CRYS ER 20 MEQ PO TBCR
EXTENDED_RELEASE_TABLET | ORAL | Status: AC
  Filled 2014-03-27: qty 1

## 2014-03-27 MED ORDER — ONDANSETRON HCL 4 MG/2ML IJ SOLN: 4.0000 mg | Freq: Once | INTRAMUSCULAR | Status: DC

## 2014-03-27 MED ORDER — ONDANSETRON HCL 4 MG/2ML IJ SOLN
INTRAMUSCULAR | Status: AC
  Filled 2014-03-27: qty 2

## 2014-03-27 MED ORDER — ONDANSETRON HCL 4 MG/2ML IJ SOLN
4.0000 mg | Freq: Once | INTRAMUSCULAR | Status: AC
  Administered 2014-03-27: 4 mg via INTRAVENOUS

## 2014-03-27 MED ORDER — CEFTRIAXONE SODIUM 1 G IJ SOLR
1.0000 g | Freq: Once | INTRAMUSCULAR | Status: AC
  Administered 2014-03-27: 1 g via INTRAVENOUS

## 2014-03-27 NOTE — Discharge Instructions (Signed)
Hypokalemia Hypokalemia means that the amount of potassium in the blood is lower than normal.Potassium is a chemical, called an electrolyte, that helps regulate the amount of fluid in the body. It also stimulates muscle contraction and helps nerves function properly.Most of the body's potassium is inside of cells, and only a very small amount is in the blood. Because the amount in the blood is so small, minor changes can be life-threatening. CAUSES  Antibiotics.  Diarrhea or vomiting.  Using laxatives too much, which can cause diarrhea.  Chronic kidney disease.  Water pills (diuretics).  Eating disorders (bulimia).  Low magnesium level.  Sweating a lot. SIGNS AND SYMPTOMS  Weakness.  Constipation.  Fatigue.  Muscle cramps.  Mental confusion.  Skipped heartbeats or irregular heartbeat (palpitations).  Tingling or numbness. DIAGNOSIS  Your health care provider can diagnose hypokalemia with blood tests. In addition to checking your potassium level, your health care provider may also check other lab tests. TREATMENT Hypokalemia can be treated with potassium supplements taken by mouth or adjustments in your current medicines. If your potassium level is very low, you may need to get potassium through a vein (IV) and be monitored in the hospital. A diet high in potassium is also helpful. Foods high in potassium are:  Nuts, such as peanuts and pistachios.  Seeds, such as sunflower seeds and pumpkin seeds.  Peas, lentils, and lima beans.  Whole grain and bran cereals and breads.  Fresh fruit and vegetables, such as apricots, avocado, bananas, cantaloupe, kiwi, oranges, tomatoes, asparagus, and potatoes.  Orange and tomato juices.  Red meats.  Fruit yogurt. HOME CARE INSTRUCTIONS  Take all medicines as prescribed by your health care provider.  Maintain a healthy diet by including nutritious food, such as fruits, vegetables, nuts, whole grains, and lean meats.  If  you are taking a laxative, be sure to follow the directions on the label. SEEK MEDICAL CARE IF:  Your weakness gets worse.  You feel your heart pounding or racing.  You are vomiting or having diarrhea.  You are diabetic and having trouble keeping your blood glucose in the normal range. SEEK IMMEDIATE MEDICAL CARE IF:  You have chest pain, shortness of breath, or dizziness.  You are vomiting or having diarrhea for more than 2 days.  You faint. MAKE SURE YOU:   Understand these instructions.  Will watch your condition.  Will get help right away if you are not doing well or get worse. Document Released: 09/05/2005 Document Revised: 06/26/2013 Document Reviewed: 03/08/2013 [facility] Patient Information 2015 [place], [place]. This information is not intended to replace advice given to you by your health care provider. Make sure you discuss any questions you have with your health care provider.  Urinary Tract Infection Urinary tract infections (UTIs) can develop anywhere along your urinary tract. Your urinary tract is your body's drainage system for removing wastes and extra water. Your urinary tract includes two kidneys, two ureters, a bladder, and a urethra. Your kidneys are a pair of bean-shaped organs. Each kidney is about the size of your fist. They are located below your ribs, one on each side of your spine. CAUSES Infections are caused by microbes, which are microscopic organisms, including fungi, viruses, and bacteria. These organisms are so small that they can only be seen through a microscope. Bacteria are the microbes that most commonly cause UTIs. SYMPTOMS  Symptoms of UTIs may vary by age and gender of the patient and by the location of the infection. Symptoms in young  women typically include a frequent and intense urge to urinate and a painful, burning feeling in the bladder or urethra during urination. Older women and men are more likely to be tired, shaky, and weak and have  muscle aches and abdominal pain. A fever may mean the infection is in your kidneys. Other symptoms of a kidney infection include pain in your back or sides below the ribs, nausea, and vomiting. DIAGNOSIS To diagnose a UTI, your caregiver will ask you about your symptoms. Your caregiver also will ask to provide a urine sample. The urine sample will be tested for bacteria and white blood cells. White blood cells are made by your body to help fight infection. TREATMENT  Typically, UTIs can be treated with medication. Because most UTIs are caused by a bacterial infection, they usually can be treated with the use of antibiotics. The choice of antibiotic and length of treatment depend on your symptoms and the type of bacteria causing your infection. HOME CARE INSTRUCTIONS  If you were prescribed antibiotics, take them exactly as your caregiver instructs you. Finish the medication even if you feel better after you have only taken some of the medication.  Drink enough water and fluids to keep your urine clear or pale yellow.  Avoid caffeine, tea, and carbonated beverages. They tend to irritate your bladder.  Empty your bladder often. Avoid holding urine for long periods of time.  Empty your bladder before and after sexual intercourse.  After a bowel movement, women should cleanse from front to back. Use each tissue only once. SEEK MEDICAL CARE IF:   You have back pain.  You develop a fever.  Your symptoms do not begin to resolve within 3 days. SEEK IMMEDIATE MEDICAL CARE IF:   You have severe back pain or lower abdominal pain.  You develop chills.  You have nausea or vomiting.  You have continued burning or discomfort with urination. MAKE SURE YOU:   Understand these instructions.  Will watch your condition.  Will get help right away if you are not doing well or get worse. Document Released: 06/15/2005 Document Revised: 03/06/2012 Document Reviewed: 10/14/2011 [facility] Patient  Information 2015 [place], [place]. This information is not intended to replace advice given to you by your health care provider. Make sure you discuss any questions you have with your health care provider.

## 2014-03-27 NOTE — ED Notes (Signed)
Pt states she had a medication change one week ago.  Pt feels shaky, diaphoretic, weakness and not feeling well.  Pt states the last time she felt bad, she had hypokalemia.  Pt anxious.

## 2014-03-27 NOTE — ED Provider Notes (Signed)
CSN: [id number]     Arrival date & time 03/27/14  7824 History   First MD Initiated Contact with Patient 03/27/14 [phone]     Chief Complaint  Patient presents with  . Tremors  . Weakness     (Consider location/radiation/quality/duration/timing/severity/associated sxs/prior Treatment) HPI Comments: Patient presents to the ER for evaluation of racing heartbeat, feeling very jittery. Patient reports that symptoms began suddenly this morning. She was sitting at work and became very shaky, then started sweating and felt weak, like she was going to pass out. She has not had any chest pain. There is no shortness of breath. No headache or vision change. Patient reports that the last time she felt like this, she had low potassium. She did recently have her insulin and oral diabetes medications increased by her doctor one week ago.  Patient is a 43 y.o. female presenting with weakness.  Weakness Pertinent negatives include no chest pain and no shortness of breath.    Past Medical History  Diagnosis Date  . Diabetes mellitus   . Kidney stones   . Hyperlipidemia   . Depression   . Hypokalemia    Past Surgical History  Procedure Laterality Date  . Lithotripsy      89- kidney stone sx   Family History  Problem Relation Age of Onset  . Atrial fibrillation Mother    History  Substance Use Topics  . Smoking status: Never Smoker   . Smokeless tobacco: Not on file  . Alcohol Use: No   OB History   Grav Para Term Preterm Abortions TAB SAB Ect Mult Living                 Review of Systems  Constitutional: Positive for diaphoresis.  Respiratory: Negative for shortness of breath.   Cardiovascular: Negative for chest pain.  Neurological: Positive for weakness.  All other systems reviewed and are negative.     Allergies  Bactrim and Morphine and related  Home Medications   Prior to Admission medications   Medication Sig Start Date End Date Taking? Authorizing Provider  aspirin 81  MG tablet Take 81 mg by mouth daily.   Yes Historical Provider, MD  buPROPion (WELLBUTRIN XL) 150 MG 24 hr tablet Take 150 mg by mouth daily.   Yes Historical Provider, MD  glipiZIDE (GLUCOTROL) 5 MG tablet Take 10 mg by mouth 2 (two) times daily before a meal.    Yes Historical Provider, MD  indapamide (LOZOL) 2.5 MG tablet Take 5 mg by mouth daily.    Yes Historical Provider, MD  insulin glargine (LANTUS) 100 UNIT/ML injection Inject 35 Units into the skin at bedtime.   Yes Historical Provider, MD  lisinopril (PRINIVIL,ZESTRIL) 5 MG tablet Take 10 mg by mouth daily.    Yes Historical Provider, MD  pantoprazole (PROTONIX) 40 MG tablet Take 40 mg by mouth daily.   Yes Historical Provider, MD  potassium citrate (UROCIT-K) 10 MEQ (1080 MG) SR tablet Take 10 mEq by mouth 3 (three) times daily with meals.   Yes Historical Provider, MD  rosuvastatin (CRESTOR) 40 MG tablet Take 40 mg by mouth daily.   Yes Historical Provider, MD  sitaGLIPtin-metformin (JANUMET) 50-1000 MG per tablet Take 1 tablet by mouth 2 (two) times daily with a meal.   Yes Historical Provider, MD  cephALEXin (KEFLEX) 500 MG capsule Take 1 capsule (500 mg total) by mouth 3 (three) times daily. 03/27/14   Orpah Greek, MD  citalopram (CELEXA) 20 MG tablet Take  20 mg by mouth daily.    Historical Provider, MD   BP 100/58  Pulse 89  Temp(Src) 98.7 F (37.1 C) (Oral)  Resp 20  Ht 5\' 5"  (1.651 m)  Wt 214 lb (97.07 kg)  BMI 35.61 kg/m2  SpO2 96%  LMP 01/21/2014 Physical Exam  Constitutional: She is oriented to person, place, and time. She appears well-developed and well-nourished. She appears distressed (appears anxious).  HENT:  Head: Normocephalic and atraumatic.  Right Ear: Hearing normal.  Left Ear: Hearing normal.  Nose: Nose normal.  Mouth/Throat: Oropharynx is clear and moist and mucous membranes are normal.  Eyes: Conjunctivae and EOM are normal. Pupils are equal, round, and reactive to light.  Neck: Normal  range of motion. Neck supple.  Cardiovascular: Regular rhythm, S1 normal and S2 normal.  Exam reveals no gallop and no friction rub.   No murmur heard. Pulmonary/Chest: Effort normal and breath sounds normal. No respiratory distress. She exhibits no tenderness.  Abdominal: Soft. Normal appearance and bowel sounds are normal. There is no hepatosplenomegaly. There is no tenderness. There is no rebound, no guarding, no tenderness at McBurney's point and negative Murphy's sign. No hernia.  Musculoskeletal: Normal range of motion.  Neurological: She is alert and oriented to person, place, and time. She has normal strength. No cranial nerve deficit or sensory deficit. Coordination normal. GCS eye subscore is 4. GCS verbal subscore is 5. GCS motor subscore is 6.  Skin: Skin is warm, dry and intact. No rash noted. No cyanosis.  Psychiatric: She has a normal mood and affect. Her speech is normal and behavior is normal. Thought content normal.    ED Course  Procedures (including critical care time) Labs Review Labs Reviewed  CBC WITH DIFFERENTIAL - Abnormal; Notable for the following:    WBC 13.0 (*)    RBC 5.63 (*)    Hemoglobin 17.5 (*)    HCT 49.5 (*)    Neutro Abs 8.7 (*)    All other components within normal limits  COMPREHENSIVE METABOLIC PANEL - Abnormal; Notable for the following:    Potassium 3.3 (*)    Chloride 92 (*)    Glucose, Bld 194 (*)    BUN 24 (*)    GFR calc non Af Amer 68 (*)    GFR calc Af Amer 79 (*)    Anion gap 21 (*)    All other components within normal limits  URINALYSIS, ROUTINE W REFLEX MICROSCOPIC - Abnormal; Notable for the following:    APPearance CLOUDY (*)    Hgb urine dipstick LARGE (*)    Ketones, ur 15 (*)    Protein, ur 30 (*)    Nitrite POSITIVE (*)    Leukocytes, UA LARGE (*)    All other components within normal limits  URINE MICROSCOPIC-ADD ON - Abnormal; Notable for the following:    Squamous Epithelial / LPF FEW (*)    Bacteria, UA MANY (*)     All other components within normal limits  CBG MONITORING, ED - Abnormal; Notable for the following:    Glucose-Capillary 180 (*)    All other components within normal limits  TROPONIN I    Imaging Review No results found.   EKG Interpretation   Date/Time:  Thursday March 27 2014 09:34:34 EDT Ventricular Rate:  95 PR Interval:  134 QRS Duration: 94 QT Interval:  364 QTC Calculation: 457 R Axis:   52 Text Interpretation:  Normal sinus rhythm Normal ECG Confirmed by Amarachukwu Lakatos   MD,  Thaniel Coluccio [phone]) on 03/27/2014 9:38:44 AM      MDM   Final diagnoses:  Hypokalemia  UTI (lower urinary tract infection)    Presents to the ER for evaluation of weakness and generalized shakiness. No focal neurologic symptoms and neurologic exam was unremarkable. Patient concerned about her recent medication changes as well as a history of hypokalemia. She very slight hypokalemia here, administered additional by mouth potassium. She does have evidence of urinary tract infection, likely be source of the patient's symptoms. Patient initiated on antibiotic coverage, will discharge with oral antibiotics and followup with primary doctor.    Orpah Greek, MD 03/27/14 [phone]

## 2016-06-30 ENCOUNTER — Emergency Department (HOSPITAL_BASED_OUTPATIENT_CLINIC_OR_DEPARTMENT_OTHER): Payer: Managed Care, Other (non HMO)

## 2016-06-30 ENCOUNTER — Emergency Department (HOSPITAL_BASED_OUTPATIENT_CLINIC_OR_DEPARTMENT_OTHER)
Admission: EM | Admit: 2016-06-30 | Discharge: 2016-07-01 | Disposition: A | Discharge status: Home / Self Care | Payer: Managed Care, Other (non HMO) | Attending: Emergency Medicine | Admitting: Emergency Medicine

## 2016-06-30 ENCOUNTER — Encounter (HOSPITAL_BASED_OUTPATIENT_CLINIC_OR_DEPARTMENT_OTHER): Payer: Self-pay | Admitting: Emergency Medicine

## 2016-06-30 DIAGNOSIS — R0602 Shortness of breath: Secondary | ICD-10-CM | POA: Diagnosis not present

## 2016-06-30 DIAGNOSIS — Z794 Long term (current) use of insulin: Secondary | ICD-10-CM | POA: Diagnosis not present

## 2016-06-30 DIAGNOSIS — R739 Hyperglycemia, unspecified: Secondary | ICD-10-CM

## 2016-06-30 DIAGNOSIS — E860 Dehydration: Secondary | ICD-10-CM | POA: Diagnosis not present

## 2016-06-30 DIAGNOSIS — Z7982 Long term (current) use of aspirin: Secondary | ICD-10-CM | POA: Diagnosis not present

## 2016-06-30 DIAGNOSIS — Z7984 Long term (current) use of oral hypoglycemic drugs: Secondary | ICD-10-CM | POA: Diagnosis not present

## 2016-06-30 DIAGNOSIS — R319 Hematuria, unspecified: Secondary | ICD-10-CM | POA: Insufficient documentation

## 2016-06-30 DIAGNOSIS — N390 Urinary tract infection, site not specified: Secondary | ICD-10-CM | POA: Diagnosis not present

## 2016-06-30 DIAGNOSIS — E1165 Type 2 diabetes mellitus with hyperglycemia: Secondary | ICD-10-CM | POA: Insufficient documentation

## 2016-06-30 LAB — URINALYSIS, ROUTINE W REFLEX MICROSCOPIC
Bilirubin Urine: NEGATIVE
Glucose, UA: >1000 mg/dL — AB
Ketones, ur: 15 mg/dL — AB
Nitrite: NEGATIVE
Protein, ur: 30 mg/dL — AB
Specific Gravity, Urine: 1.026 (ref 1.005–1.030)
pH: 6 (ref 5.0–8.0)

## 2016-06-30 LAB — COMPREHENSIVE METABOLIC PANEL
ALT: 26 U/L (ref 14–54)
AST: 20 U/L (ref 15–41)
Albumin: 4.6 g/dL (ref 3.5–5.0)
Alkaline Phosphatase: 50 U/L (ref 38–126)
Anion gap: 12 (ref 5–15)
BUN: 21 mg/dL — ABNORMAL HIGH (ref 6–20)
CO2: 28 mmol/L (ref 22–32)
Calcium: 10.4 mg/dL — ABNORMAL HIGH (ref 8.9–10.3)
Chloride: 95 mmol/L — ABNORMAL LOW (ref 101–111)
Creatinine, Ser: 0.9 mg/dL (ref 0.44–1.00)
GFR calc Af Amer: >60 mL/min (ref >60)
GFR calc non Af Amer: >60 mL/min (ref >60)
Glucose, Bld: 172 mg/dL — ABNORMAL HIGH (ref 65–99)
Potassium: 2.6 mmol/L — CL (ref 3.5–5.1)
Sodium: 135 mmol/L (ref 135–145)
Total Bilirubin: 0.9 mg/dL (ref 0.3–1.2)
Total Protein: 8 g/dL (ref 6.5–8.1)

## 2016-06-30 LAB — CBC WITH DIFFERENTIAL/PLATELET
Basophils Absolute: 0 10*3/uL (ref 0.0–0.1)
Basophils Relative: 0 %
Eosinophils Absolute: 0.3 10*3/uL (ref 0.0–0.7)
Eosinophils Relative: 2 %
HCT: 48.2 % — ABNORMAL HIGH (ref 36.0–46.0)
Hemoglobin: 17.8 g/dL — ABNORMAL HIGH (ref 12.0–15.0)
Lymphocytes Relative: 32 %
Lymphs Abs: 3.9 10*3/uL (ref 0.7–4.0)
MCH: 31.2 pg (ref 26.0–34.0)
MCHC: 36.9 g/dL — ABNORMAL HIGH (ref 30.0–36.0)
MCV: 84.4 fL (ref 78.0–100.0)
Monocytes Absolute: 0.8 10*3/uL (ref 0.1–1.0)
Monocytes Relative: 6 %
Neutro Abs: 7.3 10*3/uL (ref 1.7–7.7)
Neutrophils Relative %: 60 %
Platelets: 332 10*3/uL (ref 150–400)
RBC: 5.71 MIL/uL — ABNORMAL HIGH (ref 3.87–5.11)
RDW: 13 % (ref 11.5–15.5)
WBC: 12.2 10*3/uL — ABNORMAL HIGH (ref 4.0–10.5)

## 2016-06-30 LAB — URINE MICROSCOPIC-ADD ON

## 2016-06-30 LAB — I-STAT VENOUS BLOOD GAS, ED
Acid-Base Excess: 9 mmol/L — ABNORMAL HIGH (ref 0.0–2.0)
Bicarbonate: 30.6 mmol/L — ABNORMAL HIGH (ref 20.0–28.0)
O2 Saturation: 98 %
Patient temperature: 98.4
TCO2: 32 mmol/L (ref 0–100)
pCO2, Ven: 33.2 mmHg — ABNORMAL LOW (ref 44.0–60.0)
pH, Ven: 7.573 — ABNORMAL HIGH (ref 7.250–7.430)
pO2, Ven: 85 mmHg — ABNORMAL HIGH (ref 32.0–45.0)

## 2016-06-30 LAB — I-STAT CG4 LACTIC ACID, ED
Lactic Acid, Venous: 2.97 mmol/L (ref 0.5–1.9)
Lactic Acid, Venous: 1.99 mmol/L (ref 0.5–1.9)

## 2016-06-30 LAB — PREGNANCY, URINE: Preg Test, Ur: NEGATIVE

## 2016-06-30 LAB — D-DIMER, QUANTITATIVE: D-Dimer, Quant: <0.27 ug/mL-FEU (ref 0.00–0.50)

## 2016-06-30 LAB — CBG MONITORING, ED
Glucose-Capillary: 286 mg/dL — ABNORMAL HIGH (ref 65–99)
Glucose-Capillary: 105 mg/dL — ABNORMAL HIGH (ref 65–99)
Glucose-Capillary: 98 mg/dL (ref 65–99)

## 2016-06-30 LAB — LIPASE, BLOOD: Lipase: 39 U/L (ref 11–51)

## 2016-06-30 LAB — TROPONIN I: Troponin I: <0.03 ng/mL (ref <0.03)

## 2016-06-30 MED ORDER — SODIUM CHLORIDE 0.9 % IV BOLUS (SEPSIS)
1000.0000 mL | Freq: Once | INTRAVENOUS | Status: AC
  Administered 2016-06-30: 1000 mL via INTRAVENOUS

## 2016-06-30 MED ORDER — DEXTROSE 5 % IV SOLN
1.0000 g | Freq: Once | INTRAVENOUS | Status: AC
  Administered 2016-06-30: 1 g via INTRAVENOUS
  Filled 2016-06-30: qty 10

## 2016-06-30 MED ORDER — POTASSIUM CHLORIDE CRYS ER 20 MEQ PO TBCR
40.0000 meq | EXTENDED_RELEASE_TABLET | Freq: Once | ORAL | Status: AC
  Administered 2016-06-30: 40 meq via ORAL
  Filled 2016-06-30: qty 2

## 2016-06-30 MED ORDER — POTASSIUM CHLORIDE 10 MEQ/100ML IV SOLN
10.0000 meq | INTRAVENOUS | Status: AC
  Administered 2016-06-30 (×2): 10 meq via INTRAVENOUS
  Filled 2016-06-30 (×2): qty 100

## 2016-06-30 NOTE — ED Notes (Signed)
Critical Lactic Acid 2.97 reported to Dr. Sherry Ruffing.

## 2016-06-30 NOTE — ED Triage Notes (Signed)
PT in c/o headache x 3 days, woke this am and noticed blood sugar was abnormally high for her and her blood pressure was also abnormally high. Hx of DM type II but no hx of hypertension. Pt appears mildly sob and diaphoretic. Pt is alert, interactive, ambulatory in NAD.

## 2016-06-30 NOTE — ED Notes (Signed)
Graham crackers and peanut butter provided to pt per OK by MD

## 2016-06-30 NOTE — ED Notes (Signed)
CBG is 98

## 2016-06-30 NOTE — ED Notes (Signed)
piv attempt x 1 unsuccessful right posterior forearm; blood return obtained but catheter would not thread

## 2016-06-30 NOTE — ED Provider Notes (Signed)
[facility] Provider Note   CSN: [id number] Arrival date & time: 06/30/16  1541     History   Chief Complaint Chief Complaint  Patient presents with  . Hypertension  . Hyperglycemia    HPI Catherine Marshall is a 45 y.o. female with a past medical history significant for diabetes, hyperlipidemia, depression, and nephrolithiasis who presents with headache, palpitations, shortness of breath, cough, hyperglycemia, diaphoresis, malaise, and decreased urination. Patient reports that for the last 3 days, she has had a mild headache that is frontal in location, mild in severity, and nonradiating. She reports that at work today, she began "feeling off" and feeling "weird". She says that she took her glucose and it was 456. She gave herself 40 units of insulin and it improved to 360. At 1 PM she gave her some point where units of insulin. She says that today she has been extremely diaphoretic and "swelling all over". She is having palpitations and sensation of tachycardia. She has been having shortness of breath that is worse with exertion. She denies any chest pain. She reports a cough that is intermittently productive of yellow sputum. She says that when her glucose is high, she usually has polyuria however she says today that she has not been paying as much. She denies abdominal pain, nausea, vomiting and denies history of DKA. She denies any fevers or chills. She denies any vision changes, numbness, tingling, or weakness of any extremities. She says she's never had difficulty managing her sugars in the past.  The history is provided by the patient and medical records. No language interpreter was used.  Hyperglycemia  Blood sugar level PTA:  456 Severity:  Severe Onset quality:  Gradual Duration:  1 day Timing:  Intermittent Progression:  Waxing and waning Chronicity:  New Diabetes status:  Controlled with insulin and controlled with oral medications Context: not new diabetes diagnosis  and not recent change in diet   Relieved by:  Nothing Ineffective treatments:  None tried Associated symptoms: dehydration, diaphoresis, fatigue and shortness of breath   Associated symptoms: no abdominal pain, no altered mental status, no chest pain, no dizziness, no dysuria, no fever, no nausea, no polyuria, no syncope, no vomiting and no weakness     Past Medical History:  Diagnosis Date  . Depression   . Diabetes mellitus   . Hyperlipidemia   . Hypokalemia   . Kidney stones     There are no active problems to display for this patient.   Past Surgical History:  Procedure Laterality Date  . LITHOTRIPSY     91- kidney stone sx    OB History    No data available       Home Medications    Prior to Admission medications   Medication Sig Start Date End Date Taking? Authorizing Provider  aspirin 81 MG tablet Take 81 mg by mouth daily.    Historical Provider, MD  buPROPion (WELLBUTRIN XL) 150 MG 24 hr tablet Take 150 mg by mouth daily.    Historical Provider, MD  cephALEXin (KEFLEX) 500 MG capsule Take 1 capsule (500 mg total) by mouth 3 (three) times daily. 03/27/14   Orpah Greek, MD  citalopram (CELEXA) 20 MG tablet Take 20 mg by mouth daily.    Historical Provider, MD  glipiZIDE (GLUCOTROL) 5 MG tablet Take 10 mg by mouth 2 (two) times daily before a meal.     Historical Provider, MD  indapamide (LOZOL) 2.5 MG tablet Take 5 mg by  mouth daily.     Historical Provider, MD  insulin glargine (LANTUS) 100 UNIT/ML injection Inject 35 Units into the skin at bedtime.    Historical Provider, MD  lisinopril (PRINIVIL,ZESTRIL) 5 MG tablet Take 10 mg by mouth daily.     Historical Provider, MD  pantoprazole (PROTONIX) 40 MG tablet Take 40 mg by mouth daily.    Historical Provider, MD  potassium citrate (UROCIT-K) 10 MEQ (1080 MG) SR tablet Take 10 mEq by mouth 3 (three) times daily with meals.    Historical Provider, MD  rosuvastatin (CRESTOR) 40 MG tablet Take 40 mg by mouth  daily.    Historical Provider, MD  sitaGLIPtin-metformin (JANUMET) 50-1000 MG per tablet Take 1 tablet by mouth 2 (two) times daily with a meal.    Historical Provider, MD    Family History Family History  Problem Relation Age of Onset  . Atrial fibrillation Mother     Social History Social History  Substance Use Topics  . Smoking status: Never Smoker  . Smokeless tobacco: Not on file  . Alcohol use No     Allergies   Bactrim [sulfamethoxazole-trimethoprim] and Morphine and related   Review of Systems Review of Systems  Constitutional: Positive for chills, diaphoresis and fatigue. Negative for fever.  HENT: Negative for rhinorrhea.   Respiratory: Positive for shortness of breath. Negative for cough, chest tightness, wheezing and stridor.   Cardiovascular: Negative for chest pain and syncope.  Gastrointestinal: Negative for abdominal pain, nausea and vomiting.  Endocrine: Negative for polyuria.  Genitourinary: Positive for decreased urine volume. Negative for dysuria, flank pain, frequency, pelvic pain and urgency.  Musculoskeletal: Negative for back pain, neck pain and neck stiffness.  Skin: Negative for rash and wound.  Neurological: Positive for light-headedness. Negative for dizziness, seizures and weakness.  Psychiatric/Behavioral: Negative for agitation.  All other systems reviewed and are negative.    Physical Exam Updated Vital Signs BP 157/96   Pulse 107   Temp 98.4 F (36.9 C)   Resp 13   Ht 5\' 5"  (1.651 m)   Wt 214 lb (97.1 kg)   LMP 06/24/2016   SpO2 96%   BMI 35.61 kg/m   Physical Exam  Constitutional: She is oriented to person, place, and time. She appears well-developed and well-nourished. No distress.  HENT:  Head: Normocephalic and atraumatic.  Mouth/Throat: Oropharynx is clear and moist. No oropharyngeal exudate.  Eyes: Conjunctivae are normal.  Neck: Normal range of motion. Neck supple.  Cardiovascular: Normal rate and regular rhythm.     No murmur heard. Pulmonary/Chest: Effort normal and breath sounds normal. No stridor. No respiratory distress. She has no wheezes. She has no rales. She exhibits no tenderness.  Abdominal: Soft. There is no tenderness.  Musculoskeletal: She exhibits no edema or tenderness.  Neurological: She is alert and oriented to person, place, and time. She has normal reflexes. She displays normal reflexes. No cranial nerve deficit. She exhibits normal muscle tone. Coordination normal.  Skin: Skin is warm. Capillary refill takes less than 2 seconds. No rash noted. She is diaphoretic. No erythema.  Psychiatric: She has a normal mood and affect.  Nursing note and vitals reviewed.    ED Treatments / Results  Labs (all labs ordered are listed, but only abnormal results are displayed) Labs Reviewed  CBC WITH DIFFERENTIAL/PLATELET - Abnormal; Notable for the following:       Result Value   WBC 12.2 (*)    RBC 5.71 (*)    Hemoglobin 17.8 (*)  HCT 48.2 (*)    MCHC 36.9 (*)    All other components within normal limits  COMPREHENSIVE METABOLIC PANEL - Abnormal; Notable for the following:    Potassium 2.6 (*)    Chloride 95 (*)    Glucose, Bld 172 (*)    BUN 21 (*)    Calcium 10.4 (*)    All other components within normal limits  URINALYSIS, ROUTINE W REFLEX MICROSCOPIC (NOT AT [facility]) - Abnormal; Notable for the following:    APPearance TURBID (*)    Glucose, UA >1000 (*)    Hgb urine dipstick MODERATE (*)    Ketones, ur 15 (*)    Protein, ur 30 (*)    Leukocytes, UA LARGE (*)    All other components within normal limits  URINE MICROSCOPIC-ADD ON - Abnormal; Notable for the following:    Squamous Epithelial / LPF 0-5 (*)    Bacteria, UA MANY (*)    All other components within normal limits  CBG MONITORING, ED - Abnormal; Notable for the following:    Glucose-Capillary 286 (*)    All other components within normal limits  I-STAT CG4 LACTIC ACID, ED - Abnormal; Notable for the following:     Lactic Acid, Venous 2.97 (*)    All other components within normal limits  I-STAT VENOUS BLOOD GAS, ED - Abnormal; Notable for the following:    pH, Ven 7.573 (*)    pCO2, Ven 33.2 (*)    pO2, Ven 85.0 (*)    Bicarbonate 30.6 (*)    Acid-Base Excess 9.0 (*)    All other components within normal limits  CBG MONITORING, ED - Abnormal; Notable for the following:    Glucose-Capillary 105 (*)    All other components within normal limits  I-STAT CG4 LACTIC ACID, ED - Abnormal; Notable for the following:    Lactic Acid, Venous 1.99 (*)    All other components within normal limits  CULTURE, BLOOD (ROUTINE X 2)  LIPASE, BLOOD  D-DIMER, QUANTITATIVE (NOT AT [facility])  PREGNANCY, URINE  TROPONIN I  BLOOD GAS, VENOUS  CBG MONITORING, ED    EKG  EKG Interpretation  Date/Time:  Thursday June 30 2016 15:55:28 EDT Ventricular Rate:  110 PR Interval:    QRS Duration: 104 QT Interval:  350 QTC Calculation: 474 R Axis:   77 Text Interpretation:  Sinus tachycardia Minimal ST depression, inferior leads No STEMI Abnormal ECG Confirmed by Sherry Ruffing MD, [place]rell Gave [id number]) on 06/30/2016 3:59:04 PM Also confirmed by Sherry Ruffing MD, [place] [phone]), editor Yehuda Mao [phone])  on 06/30/2016 4:17:37 PM       Radiology Dg Chest 2 View  Result Date: 06/30/2016 CLINICAL DATA:  45 year old female with shortness of breath. Hyperglycemia. Headache. Initial encounter. EXAM: CHEST  2 VIEW COMPARISON:  CT Abdomen and Pelvis 12/28/2015. FINDINGS: Normal cardiac size and mediastinal contours. Visualized tracheal air column is within normal limits. Lung volumes are within normal limits. No pneumothorax, pulmonary edema, pleural effusion or confluent pulmonary opacity. Thoracic endplate degeneration. No acute osseous abnormality identified. IMPRESSION: No acute cardiopulmonary abnormality. Electronically Signed   By: Genevie Ann M.D.   On: 06/30/2016 19:07   Ct Renal Stone Study  Result Date:  06/30/2016 CLINICAL DATA:  Initial evaluation for acute hematuria. History of stones. EXAM: CT ABDOMEN AND PELVIS WITHOUT CONTRAST TECHNIQUE: Multidetector CT imaging of the abdomen and pelvis was performed following the standard protocol without IV contrast. COMPARISON:  Prior CT from 12/28/2015. FINDINGS: Lower chest: Minimal subsegmental atelectasis seen  dependently within the lung bases. Visualized lung bases are otherwise clear. Trace pericardial effusion noted. Hepatobiliary: Diffuse hypoattenuation liver consistent with steatosis. Liver otherwise unremarkable. Gallbladder within normal limits. No biliary dilatation. Pancreas: Pancreas within normal limits. Spleen: Spleen within normal limits. Adrenals/Urinary Tract: Adrenal glands are normal. Scattered nonobstructive stones present within the kidneys bilaterally. On the right, largest of these position within the lower pole and measure 3 mm. Largest of these on the left is positioned within the lower pole and measures 6 mm. No radiopaque stones seen along the course of either ureter. There is no hydronephrosis or hydroureter. Bladder within normal limits. No layering stones present within the bladder lumen. Stomach/Bowel: Stomach within normal limits. No evidence for bowel obstruction. Appendix normal. No acute inflammatory changes about the bowels. Vascular/Lymphatic: No acute vascular abnormality.  No adenopathy. Reproductive: Uterus within normal limits. 3.7 cm left ovarian cystic lesion is stable the slightly increased size from prior. Right ovary unremarkable. Other: No free air or fluid. Tiny fat containing paraumbilical hernia noted. Musculoskeletal: No acute osseous abnormality. No worrisome lytic or blastic osseous lesions. IMPRESSION: 1. Bilateral nonobstructive nephrolithiasis as above. No CT evidence for obstructive uropathy. 2. No other acute intra-abdominal or pelvic process identified. 3. Hepatic steatosis. Electronically Signed   By: Jeannine Boga M.D.   On: 06/30/2016 21:45    Procedures Procedures (including critical care time)  Medications Ordered in ED Medications  sodium chloride 0.9 % bolus 1,000 mL (0 mLs Intravenous Stopped 06/30/16 1914)  sodium chloride 0.9 % bolus 1,000 mL (0 mLs Intravenous Stopped 06/30/16 2014)  potassium chloride SA (K-DUR,KLOR-CON) CR tablet 40 mEq (40 mEq Oral Given 06/30/16 1914)  potassium chloride 10 mEq in 100 mL IVPB (0 mEq Intravenous Stopped 06/30/16 2219)  cefTRIAXone (ROCEPHIN) 1 g in dextrose 5 % 50 mL IVPB (0 g Intravenous Stopped 06/30/16 2055)     Initial Impression / Assessment and Plan / ED Course  I have reviewed the triage vital signs and the nursing notes.  Pertinent labs & imaging results that were available during my care of the patient were reviewed by me and considered in my medical decision making (see chart for details).  Clinical Course    Catherine Marshall is a 45 y.o. female with a past medical history significant for diabetes, hyperlipidemia, depression, and nephrolithiasis who presents with headache, palpitations, shortness of breath, cough, hyperglycemia, diaphoresis, malaise, and decreased urination.  History and exam are seen above.  Patient found to be tachycardic and on arrival with a heart rate in the 120s. Suspect dehydration in the setting of hyperglycemia, tachypnea, and diaphoresis. Patient was given fluids. Will start workup for occult infection, electrolyte abnormality, will look for DKA, and will do a d-dimer given the tachycardia and shortness of breath to look for PE. Patient denies any sick contacts however she was told this may be a viral infection. Anticipate reassessment after fluids, imaging, and lab workup.  Laboratory testing results are seen above. Patient found to have elevated lactic acid of 2.97. Fluids continued. CBC showed mild leukocytosis and elevated hemoglobin. Suspect dehydration concentration as source. Blood gas showed  alkalosis. Doubt DKA. CMP showed hypokalemia. Patient given oral and IV potassium. Liver function and kidney function normal. Urinalysis showed evidence of UTI and hematuria. Given patient's extensive kidney stone history, and with history of asymptomatic hydronephrosis from stone, patient will have CT scan to look for obstruction. D dimer negative.   Patient given Rocephin for UTI Given the urinalysis and reported decrease  in urine.  CT scan showed no evidence of obstructive uropathy. Chest x-ray showed no pneumonia.  Patient reports feeling significantly better after fluids and antibiotics. Patient had resolution of tachycardia, improvement in glucose measurements, and had resolution of her presenting lightheadedness. Patient expressed desire to go home and treat her UTIs and outpatient. Given Electrolyte abnormalities, patient offered admission however, patient will follow up with PCP for further management. Given patients allergies, patient will be started on levofloxacin.   Patient understood very strict return precautions for any new or worsening symptoms. Patient understood follow up instructions. Patient had no other questions or concerns and patient was discharged in good condition with resolution of presenting symptoms.   Final Clinical Impressions(s) / ED Diagnoses   Final diagnoses:  Lower urinary tract infectious disease  Hyperglycemia  Dehydration    New Prescriptions Discharge Medication List as of 07/01/2016 12:02 AM    START taking these medications   Details  levofloxacin (LEVAQUIN) 750 MG tablet Take 1 tablet (750 mg total) by mouth daily., Starting Fri 07/01/2016, Until Wed 07/06/2016, Print        Clinical Impression: 1. Lower urinary tract infectious disease   2. Hematuria   3. Hyperglycemia   4. Dehydration     Disposition: Discharge  Condition: Good  I have discussed the results, Dx and Tx plan with the pt(& family if present). He/she/they expressed  understanding and agree(s) with the plan. Discharge instructions discussed at great length. Strict return precautions discussed and pt &/or family have verbalized understanding of the instructions. No further questions at time of discharge.    Discharge Medication List as of 07/01/2016 12:02 AM    START taking these medications   Details  levofloxacin (LEVAQUIN) 750 MG tablet Take 1 tablet (750 mg total) by mouth daily., Starting Fri 07/01/2016, Until Wed 07/06/2016, Print        Follow Up: Fransisca Connors, PA-C [address] [place] [address] [phone]  Schedule an appointment as soon as possible for a visit    [facility] [address] [id number] [place] [address] [place] [phone]  If symptoms worsen     Courtney Paris, MD 07/01/16 [phone]

## 2016-07-01 MED ORDER — LEVOFLOXACIN 750 MG PO TABS: 750.0000 mg | ORAL_TABLET | Freq: Every day | ORAL | 0 refills | Status: AC

## 2016-07-05 LAB — CULTURE, BLOOD (ROUTINE X 2): Culture: NO GROWTH

## 2017-09-20 ENCOUNTER — Encounter: Payer: Self-pay | Admitting: Emergency Medicine

## 2017-09-20 ENCOUNTER — Emergency Department (INDEPENDENT_AMBULATORY_CARE_PROVIDER_SITE_OTHER)
Admission: EM | Admit: 2017-09-20 | Discharge: 2017-09-20 | Disposition: A | Discharge status: Home / Self Care | Payer: Managed Care, Other (non HMO) | Source: Home / Self Care | Attending: Family Medicine | Admitting: Family Medicine

## 2017-09-20 DIAGNOSIS — R51 Headache: Secondary | ICD-10-CM

## 2017-09-20 DIAGNOSIS — R519 Headache, unspecified: Secondary | ICD-10-CM

## 2017-09-20 MED ORDER — PREDNISONE 20 MG PO TABS: ORAL_TABLET | ORAL | 0 refills | Status: DC

## 2017-09-20 MED ORDER — METHYLPREDNISOLONE ACETATE 80 MG/ML IJ SUSP
80.0000 mg | Freq: Once | INTRAMUSCULAR | Status: AC
  Administered 2017-09-20: 80 mg via INTRAMUSCULAR

## 2017-09-20 NOTE — ED Provider Notes (Signed)
[name]    CSN: [id number] Arrival date & time: 09/20/17  [place]     History   Chief Complaint Chief Complaint  Patient presents with  . Headache    HPI Shelene Krage is a 47 y.o. female.   HPI  Angelle Isais is a 47 y.o. female presenting to UC with c/o 3 days of waxing and waning but persistent Right sided headache that is worst around Right eye and Right temple area. Pain is aching and sharp on occasion above Right eye, 6/10.  Hx of migraines a few years ago but states she has not had a HA like this before.  She did try leftover Imitrex w/o relief. She also tried Mucinex because she thought it could be a sinus HA but no relief.  She can only take Tylenol at this time because she has a urology procedure coming up so she cannot take NSAIDs.  Mild intermittent nausea but denies vomiting.  Denies fever or chills, cough, congestion, sore throat, or ear pain. No dizziness or vision change. Denies numbness or tingling in arms or legs.    Past Medical History:  Diagnosis Date  . Depression   . Diabetes mellitus   . Hyperlipidemia   . Hypokalemia   . Kidney stones     There are no active problems to display for this patient.   Past Surgical History:  Procedure Laterality Date  . LITHOTRIPSY     89- kidney stone sx    OB History    No data available       Home Medications    Prior to Admission medications   Medication Sig Start Date End Date Taking? Authorizing Provider  aspirin 81 MG tablet Take 81 mg by mouth daily.    [provider]  buPROPion (WELLBUTRIN XL) 150 MG 24 hr tablet Take 150 mg by mouth daily.    [provider]  cephALEXin (KEFLEX) 500 MG capsule Take 1 capsule (500 mg total) by mouth 3 (three) times daily. 03/27/14   Orpah Greek, MD  citalopram (CELEXA) 20 MG tablet Take 20 mg by mouth daily.    [provider]  glipiZIDE (GLUCOTROL) 5 MG tablet Take 10 mg by mouth 2 (two) times daily before a meal.      [provider]  indapamide (LOZOL) 2.5 MG tablet Take 5 mg by mouth daily.     [provider]  insulin glargine (LANTUS) 100 UNIT/ML injection Inject 35 Units into the skin at bedtime.    [provider]  lisinopril (PRINIVIL,ZESTRIL) 5 MG tablet Take 10 mg by mouth daily.     [provider]  pantoprazole (PROTONIX) 40 MG tablet Take 40 mg by mouth daily.    [provider]  potassium citrate (UROCIT-K) 10 MEQ (1080 MG) SR tablet Take 10 mEq by mouth 3 (three) times daily with meals.    [provider]  predniSONE (DELTASONE) 20 MG tablet 3 tabs po day one, then 2 po daily x 4 days 09/20/17   Noe Gens, PA-C  rosuvastatin (CRESTOR) 40 MG tablet Take 40 mg by mouth daily.    [provider]  sitaGLIPtin-metformin (JANUMET) 50-1000 MG per tablet Take 1 tablet by mouth 2 (two) times daily with a meal.    [provider]    Family History Family History  Problem Relation Age of Onset  . Atrial fibrillation Mother     Social History Social History   Tobacco Use  .  Smoking status: Never Smoker  . Smokeless tobacco: Never Used  Substance Use Topics  . Alcohol use: No  . Drug use: No     Allergies   Bactrim [sulfamethoxazole-trimethoprim] and Morphine and related   Review of Systems Review of Systems  Constitutional: Negative for chills and fever.  HENT: Negative for congestion, ear pain, sore throat, trouble swallowing and voice change.   Eyes: Positive for photophobia. Negative for pain and visual disturbance.  Respiratory: Negative for cough and shortness of breath.   Cardiovascular: Negative for chest pain and palpitations.  Gastrointestinal: Positive for nausea. Negative for abdominal pain, diarrhea and vomiting.  Musculoskeletal: Negative for arthralgias, back pain, myalgias and neck pain.  Skin: Negative for rash.  Neurological: Positive for headaches. Negative for dizziness, syncope, facial  asymmetry, weakness, light-headedness and numbness.     Physical Exam Triage Vital Signs ED Triage Vitals  Enc Vitals Group     BP 09/20/17 1925 (!) 136/91     Pulse Rate 09/20/17 1925 71     Resp --      Temp 09/20/17 1925 98.6 F (37 C)     Temp Source 09/20/17 1925 Oral     SpO2 09/20/17 1925 99 %     Weight 09/20/17 1926 213 lb (96.6 kg)     Height --      Head Circumference --      Peak Flow --      Pain Score 09/20/17 1927 6     Pain Loc --      Pain Edu? --      Excl. in [place]? --    No data found.  Updated Vital Signs BP (!) 136/91 (BP Location: Right Arm)   Pulse 71   Temp 98.6 F (37 C) (Oral)   Wt 213 lb (96.6 kg)   SpO2 99%   BMI 35.45 kg/m   Visual Acuity Right Eye Distance: 20/25 Left Eye Distance: 20/25 Bilateral Distance: 20/25  Right Eye Near:   Left Eye Near:    Bilateral Near:     Physical Exam  Constitutional: She is oriented to person, place, and time. She appears well-developed and well-nourished.  Non-toxic appearance. She does not appear ill. No distress.  HENT:  Head: Normocephalic and atraumatic.    Right Ear: Tympanic membrane normal.  Left Ear: Tympanic membrane normal.  Nose: No mucosal edema. Right sinus exhibits frontal sinus tenderness. Right sinus exhibits no maxillary sinus tenderness. Left sinus exhibits no maxillary sinus tenderness and no frontal sinus tenderness.  Mouth/Throat: Uvula is midline, oropharynx is clear and moist and mucous membranes are normal.  Mild tenderness over temporal artery, over Right eye and along medial aspect of bridge of nose.  No edema, ecchymosis, erythema or rashes   Eyes: EOM are normal. Pupils are equal, round, and reactive to light.  Neck: Normal range of motion. Neck supple. No neck rigidity.  Cardiovascular: Normal rate and regular rhythm.  Pulmonary/Chest: Effort normal and breath sounds normal. No respiratory distress. She has no wheezes.  Musculoskeletal: Normal range of motion.    Neurological: She is alert and oriented to person, place, and time. She has normal strength. She displays a negative Romberg sign. Gait normal. GCS eye subscore is 4. GCS verbal subscore is 5. GCS motor subscore is 6.  Skin: Skin is warm and dry.  Psychiatric: She has a normal mood and affect. Her behavior is normal.  Nursing note and vitals reviewed.    UC Treatments / Results  Labs (all labs ordered are listed, but only abnormal results are displayed) Labs Reviewed  SEDIMENTATION RATE    EKG  EKG Interpretation None       Radiology No results found.  Procedures Procedures (including critical care time)  Medications Ordered in UC Medications  methylPREDNISolone acetate (DEPO-MEDROL) injection 80 mg (80 mg Intramuscular Given 09/20/17 1954)     Initial Impression / Assessment and Plan / UC Course  I have reviewed the triage vital signs and the nursing notes.  Pertinent labs & imaging results that were available during my care of the patient were reviewed by me and considered in my medical decision making (see chart for details).     Normal neuro exam. Tenderness over Right frontal sinus and temporal artery. Doubt SAH, CVA or meningitis.  HA possibly due to temporal arteritis (pt is somewhat young for this, however, important to r/o), cluster HA or sinus HA.  Pt given Depo-medrol 80mg  IM in UC She declined nausea medication She cannot have Toradol due to upcoming urinary procedure for recurrent renal stones.    Mild improvement in HA Sed rate sent to lab to r/o temporal arteritis.  Home care instructions provided.  Discussed symptoms that warrant emergent care in the ED.   Final Clinical Impressions(s) / UC Diagnoses   Final diagnoses:  Right-sided headache    ED Discharge Orders        Ordered    predniSONE (DELTASONE) 20 MG tablet     09/20/17 1949       Controlled Substance Prescriptions [place] Controlled Substance Registry consulted? Not  Applicable   Tyrell Antonio 09/21/17 9357

## 2017-09-20 NOTE — ED Triage Notes (Signed)
Pt c/o HA over right eye x3 days. She has tried imitrex and mucines with no relief.

## 2017-09-21 ENCOUNTER — Telehealth: Payer: Self-pay | Admitting: Emergency Medicine

## 2017-09-21 LAB — SEDIMENTATION RATE: Sed Rate: 9 mm/h (ref 0–20)

## 2017-10-15 IMAGING — CT CT RENAL STONE PROTOCOL
2 of 4 series · 16 of 46 positions shown, 18 images · non-contrast
Comparison: Prior CT from 12/28/2015.

CLINICAL DATA: Initial evaluation for acute hematuria. History of
stones.

EXAM:
CT ABDOMEN AND PELVIS WITHOUT CONTRAST
TECHNIQUE: Multidetector CT imaging of the abdomen and pelvis was performed
following the standard protocol without IV contrast.

[Series 2: axial st · axial · 0.98mm/px · z∈[-432,+13]mm · 13 of 99 slices shown, 15 images]
[im 5/99  soft-tissue]
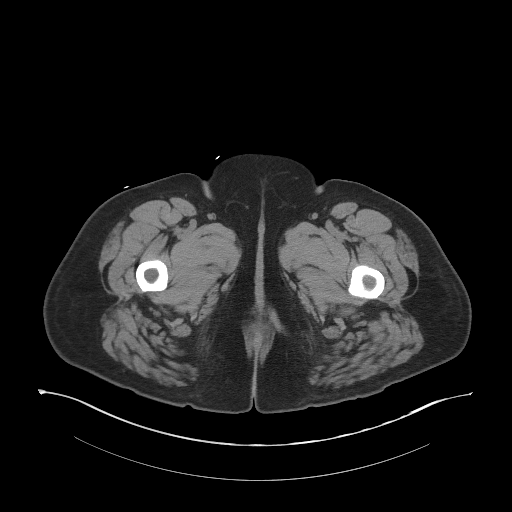
[im 5/99  bone]
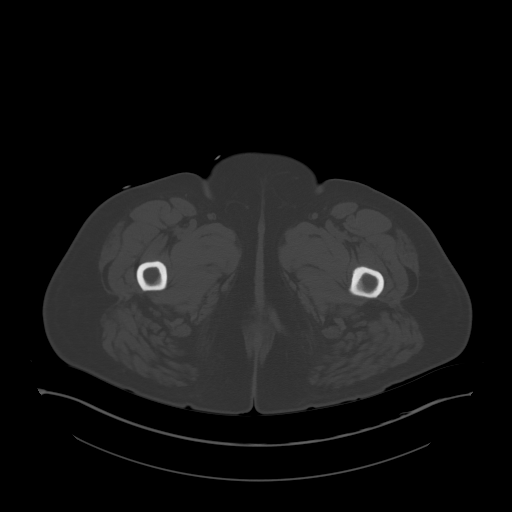
[im 13/99  soft-tissue]
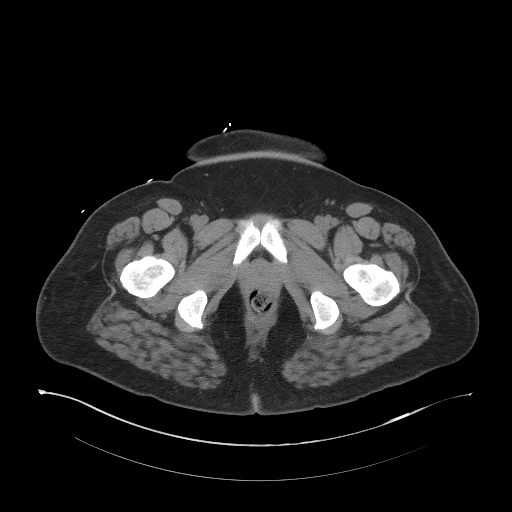
[im 21/99  soft-tissue]
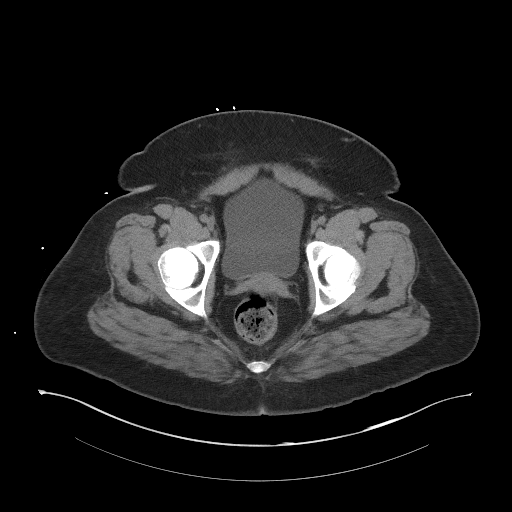
[im 29/99  soft-tissue]
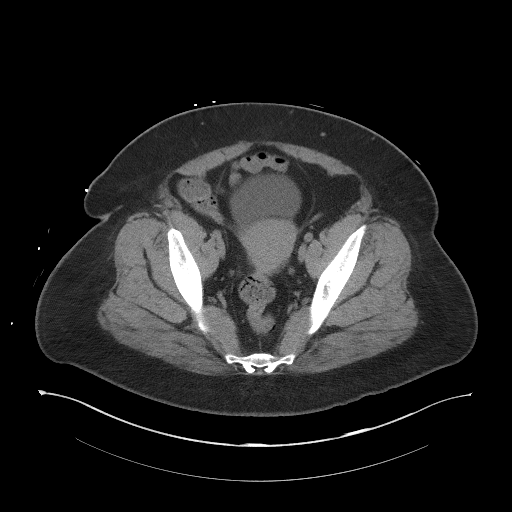
[im 33/99  soft-tissue]
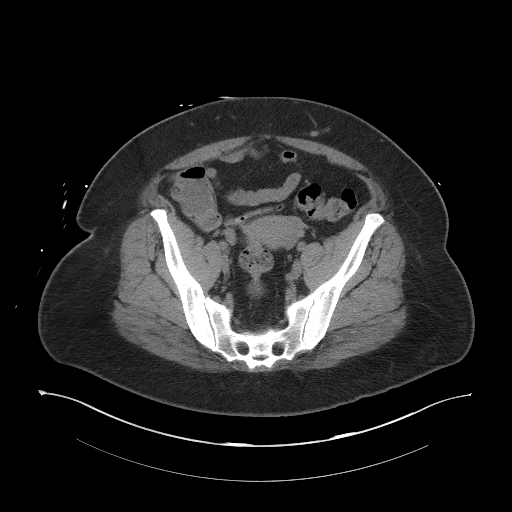
[im 41/99  soft-tissue]
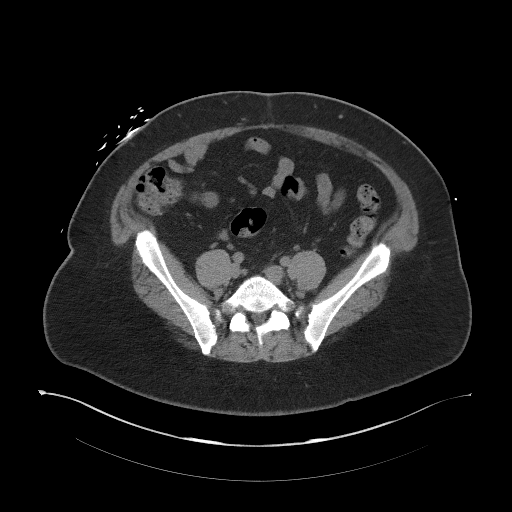
[im 50/99  soft-tissue]
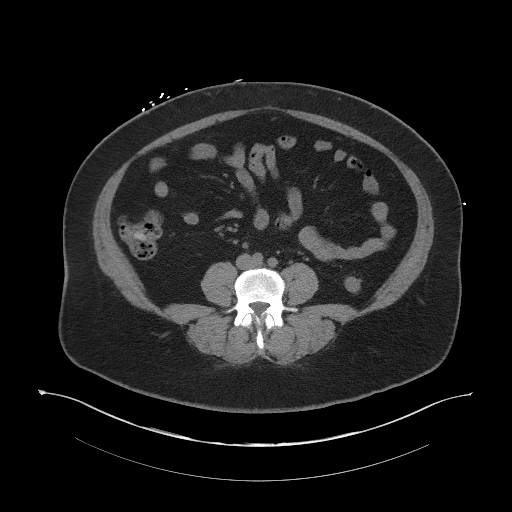
[im 58/99  soft-tissue]
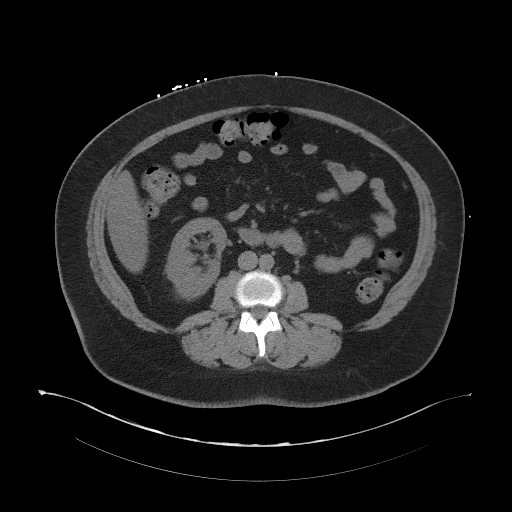
[im 66/99  soft-tissue]
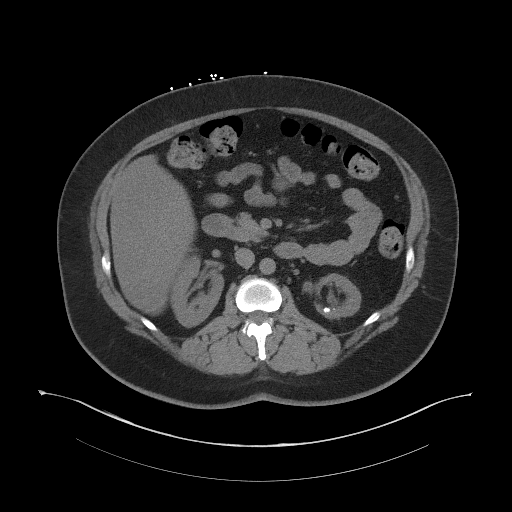
[im 66/99  bone]
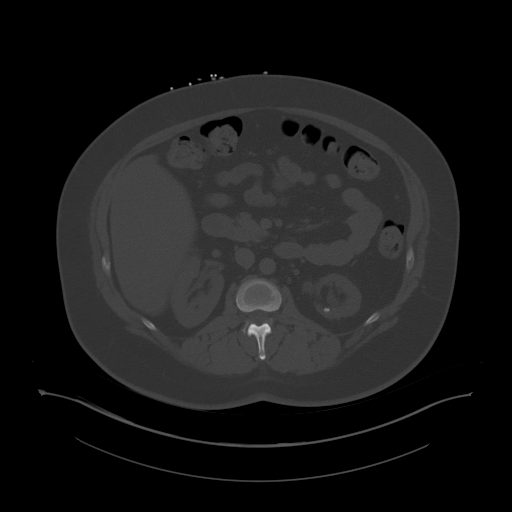
[im 70/99  soft-tissue]
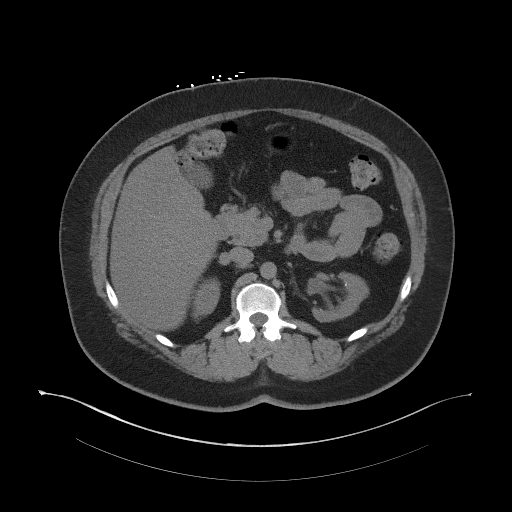
[im 78/99  soft-tissue]
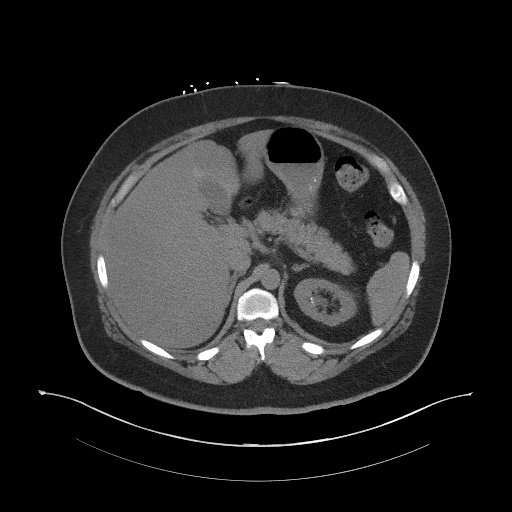
[im 86/99  soft-tissue]
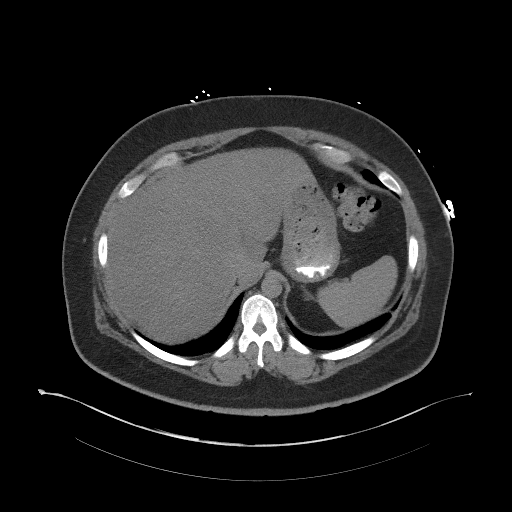
[im 94/99  soft-tissue]
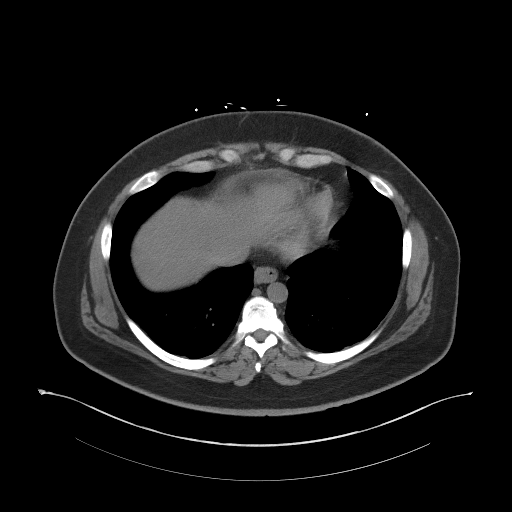

[Series 5: coronal st · coronal · 0.90mm/px · 3 of 109 slices shown]
[im 37/109  soft-tissue]
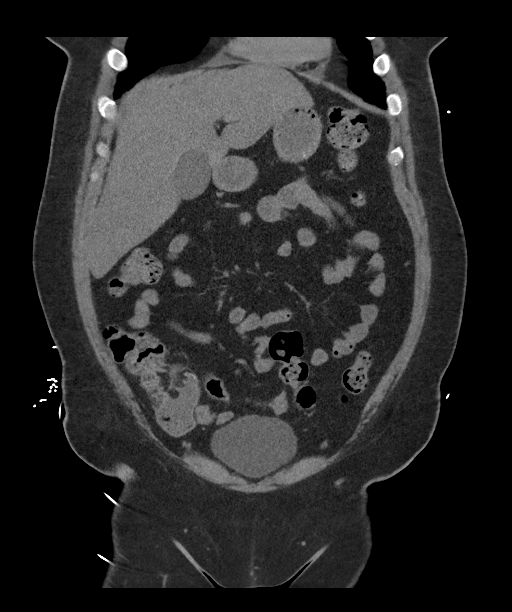
[im 49/109  soft-tissue]
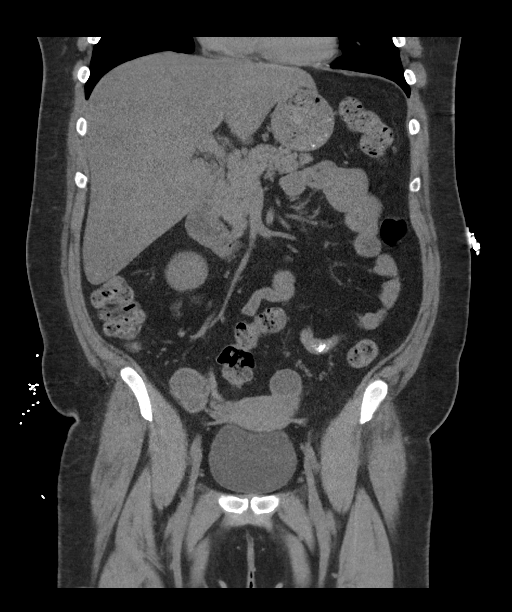
[im 61/109  soft-tissue]
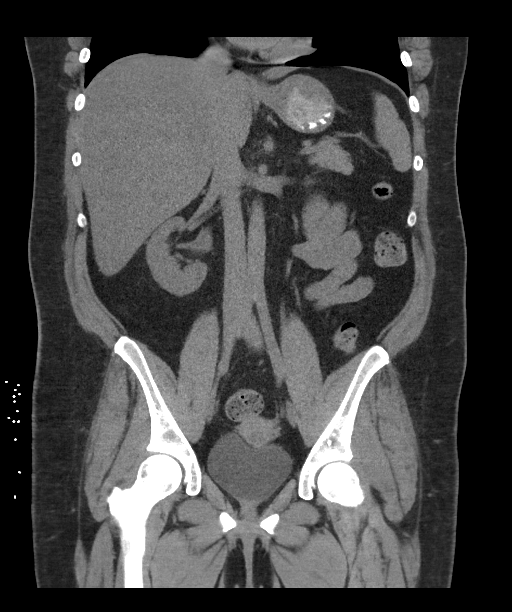

[16 of 46 positions shown; findings below may reference images not displayed]

FINDINGS: Lower chest: Minimal subsegmental atelectasis seen dependently
within the lung bases. Visualized lung bases are otherwise clear.
Trace pericardial effusion noted.

Hepatobiliary: Diffuse hypoattenuation liver consistent with
steatosis. Liver otherwise unremarkable. Gallbladder within normal
limits. No biliary dilatation.

Pancreas: Pancreas within normal limits.

Spleen: Spleen within normal limits.

Adrenals/Urinary Tract: Adrenal glands are normal.

Scattered nonobstructive stones present within the kidneys
bilaterally. On the right, largest of these position within the
lower pole and measure 3 mm. Largest of these on the left is
positioned within the lower pole and measures 6 mm. No radiopaque
stones seen along the course of either ureter. There is no
hydronephrosis or hydroureter. Bladder within normal limits. No
layering stones present within the bladder lumen.

Stomach/Bowel: Stomach within normal limits. No evidence for bowel
obstruction. Appendix normal. No acute inflammatory changes about
the bowels.

Vascular/Lymphatic: No acute vascular abnormality.  No adenopathy.

Reproductive: Uterus within normal limits. 3.7 cm left ovarian
cystic lesion is stable the slightly increased size from prior.
Right ovary unremarkable.

Other: No free air or fluid. Tiny fat containing paraumbilical
hernia noted.

Musculoskeletal: No acute osseous abnormality. No worrisome lytic or
blastic osseous lesions.
IMPRESSION: 1. Bilateral nonobstructive nephrolithiasis as above. No CT evidence
for obstructive uropathy.
2. No other acute intra-abdominal or pelvic process identified.
3. Hepatic steatosis.

## 2018-06-24 ENCOUNTER — Emergency Department (HOSPITAL_BASED_OUTPATIENT_CLINIC_OR_DEPARTMENT_OTHER): Payer: Managed Care, Other (non HMO)

## 2018-06-24 ENCOUNTER — Encounter (HOSPITAL_BASED_OUTPATIENT_CLINIC_OR_DEPARTMENT_OTHER): Payer: Self-pay | Admitting: Emergency Medicine

## 2018-06-24 ENCOUNTER — Other Ambulatory Visit: Payer: Self-pay

## 2018-06-24 ENCOUNTER — Emergency Department (HOSPITAL_BASED_OUTPATIENT_CLINIC_OR_DEPARTMENT_OTHER)
Admission: EM | Admit: 2018-06-24 | Discharge: 2018-06-24 | Disposition: A | Discharge status: Home / Self Care | Payer: Managed Care, Other (non HMO) | Attending: Emergency Medicine | Admitting: Emergency Medicine

## 2018-06-24 DIAGNOSIS — Z7984 Long term (current) use of oral hypoglycemic drugs: Secondary | ICD-10-CM | POA: Insufficient documentation

## 2018-06-24 DIAGNOSIS — N950 Postmenopausal bleeding: Secondary | ICD-10-CM | POA: Diagnosis present

## 2018-06-24 DIAGNOSIS — N83202 Unspecified ovarian cyst, left side: Secondary | ICD-10-CM | POA: Diagnosis not present

## 2018-06-24 DIAGNOSIS — Z780 Asymptomatic menopausal state: Secondary | ICD-10-CM

## 2018-06-24 DIAGNOSIS — E119 Type 2 diabetes mellitus without complications: Secondary | ICD-10-CM | POA: Insufficient documentation

## 2018-06-24 DIAGNOSIS — Z7982 Long term (current) use of aspirin: Secondary | ICD-10-CM | POA: Insufficient documentation

## 2018-06-24 DIAGNOSIS — N939 Abnormal uterine and vaginal bleeding, unspecified: Secondary | ICD-10-CM

## 2018-06-24 DIAGNOSIS — Z79899 Other long term (current) drug therapy: Secondary | ICD-10-CM | POA: Insufficient documentation

## 2018-06-24 HISTORY — DX: Unspecified ovarian cyst, unspecified side: N83.209

## 2018-06-24 LAB — URINALYSIS, ROUTINE W REFLEX MICROSCOPIC
Bilirubin Urine: NEGATIVE
Glucose, UA: NEGATIVE mg/dL
Hgb urine dipstick: NEGATIVE
Ketones, ur: NEGATIVE mg/dL
Nitrite: NEGATIVE
Protein, ur: NEGATIVE mg/dL
Specific Gravity, Urine: 1.01 (ref 1.005–1.030)
pH: 7 (ref 5.0–8.0)

## 2018-06-24 LAB — COMPREHENSIVE METABOLIC PANEL
ALT: 22 U/L (ref 0–44)
AST: 16 U/L (ref 15–41)
Albumin: 3.8 g/dL (ref 3.5–5.0)
Alkaline Phosphatase: 58 U/L (ref 38–126)
Anion gap: 7 (ref 5–15)
BUN: 12 mg/dL (ref 6–20)
CO2: 28 mmol/L (ref 22–32)
Calcium: 8.9 mg/dL (ref 8.9–10.3)
Chloride: 103 mmol/L (ref 98–111)
Creatinine, Ser: 0.77 mg/dL (ref 0.44–1.00)
GFR calc Af Amer: >60 mL/min (ref >60)
GFR calc non Af Amer: >60 mL/min (ref >60)
Glucose, Bld: 118 mg/dL — ABNORMAL HIGH (ref 70–99)
Potassium: 3.3 mmol/L — ABNORMAL LOW (ref 3.5–5.1)
Sodium: 138 mmol/L (ref 135–145)
Total Bilirubin: 1 mg/dL (ref 0.3–1.2)
Total Protein: 7 g/dL (ref 6.5–8.1)

## 2018-06-24 LAB — CBC
HCT: 44.8 % (ref 36.0–46.0)
Hemoglobin: 15.9 g/dL — ABNORMAL HIGH (ref 12.0–15.0)
MCH: 30.2 pg (ref 26.0–34.0)
MCHC: 35.5 g/dL (ref 30.0–36.0)
MCV: 85 fL (ref 78.0–100.0)
Platelets: 258 10*3/uL (ref 150–400)
RBC: 5.27 MIL/uL — ABNORMAL HIGH (ref 3.87–5.11)
RDW: 13.4 % (ref 11.5–15.5)
WBC: 9.9 10*3/uL (ref 4.0–10.5)

## 2018-06-24 LAB — WET PREP, GENITAL
Clue Cells Wet Prep HPF POC: NONE SEEN
Sperm: NONE SEEN
Trich, Wet Prep: NONE SEEN

## 2018-06-24 LAB — URINALYSIS, MICROSCOPIC (REFLEX)

## 2018-06-24 LAB — LIPASE, BLOOD: Lipase: 49 U/L (ref 11–51)

## 2018-06-24 MED ORDER — ACETAMINOPHEN 325 MG PO TABS
650.0000 mg | ORAL_TABLET | Freq: Once | ORAL | Status: AC
  Administered 2018-06-24: 650 mg via ORAL
  Filled 2018-06-24: qty 2

## 2018-06-24 MED ORDER — NAPROXEN 500 MG PO TABS: 500.0000 mg | ORAL_TABLET | Freq: Two times a day (BID) | ORAL | 0 refills | Status: AC

## 2018-06-24 MED ORDER — FLUCONAZOLE 150 MG PO TABS: 150.0000 mg | ORAL_TABLET | Freq: Every day | ORAL | 0 refills | Status: DC

## 2018-06-24 MED ORDER — KETOROLAC TROMETHAMINE 15 MG/ML IJ SOLN
15.0000 mg | Freq: Once | INTRAMUSCULAR | Status: AC
  Administered 2018-06-24: 15 mg via INTRAVENOUS
  Filled 2018-06-24: qty 1

## 2018-06-24 NOTE — ED Triage Notes (Signed)
Pt c/o severe abdominal pain with vaginal bleeding. Pt has history of ovarian cyst. Pt reports last menstrual period was 07/2016.

## 2018-06-24 NOTE — Discharge Instructions (Addendum)
You were seen in the ER for pelvic pain and vaginal bleeding. Your ultrasound showed multiple left sided ovarian cyst - we suspect this is the cause of your pain- we are treating this with naproxen. Naproxen is a nonsteroidal anti-inflammatory medication that will help with pain and swelling. Be sure to take this medication as prescribed with food, 1 pill every 12 hours,  It should be taken with food, as it can cause stomach upset, and more seriously, stomach bleeding. Do not take other nonsteroidal anti-inflammatory medications with this such as Advil, Motrin, Aleve, Mobic, Goodie Powder, or Motrin.    You make take Tylenol per over the counter dosing with these medications.   We have prescribed you new medication(s) today. Discuss the medications prescribed today with your pharmacist as they can have adverse effects and interactions with your other medicines including over the counter and prescribed medications. Seek medical evaluation if you start to experience new or abnormal symptoms after taking one of these medicines, seek care immediately if you start to experience difficulty breathing, feeling of your throat closing, facial swelling, or rash as these could be indications of a more serious allergic reaction.   Your wet prep showed findings consistent with yeast- we are treating this with diflucan.   Your urine had some bacteria- we are culturing this and will call if it is abnormal.   Your potassium was slightly low at 3.3 (normal is 3.-5-5.1), we have given you information for diet supplementation.   Given you are having post menopausal vaginal bleeding we feel it is extremely important for you to follow up with a gynecologist, if you have a gynecologist you may see them if you do not we have provided our womens clinic for you. Please follow up in 3-5 days.   Return to the ER for new or worsening symptoms or any other concerns.

## 2018-06-24 NOTE — ED Notes (Signed)
Pt in [place]

## 2018-06-24 NOTE — ED Provider Notes (Signed)
[facility] Provider Note   CSN: [id number] Arrival date & time: 06/24/18  1034     History   Chief Complaint Chief Complaint  Patient presents with  . Abdominal Pain  . Vaginal Bleeding    HPI Catherine Marshall is a 47 y.o. female with a hx of T2 diabetes mellitus, hyperlipidemia, nephrolithiasis, ovarian cyst, and depression who presents to the ED with complaints of abdominal pain and vaginal bleeding that started this AM. Patient states she woke up and had an episode of urination, when she wiped she noted that she was having vaginal bleeding- no blood in toilet bowl only on toilet paper. She states she subsequently developed lower abdominal pain, more so to the pelvic area bilaterally that was crampy/sharp in nature. Pain has been waxing/waning since onset without specific alleviating/aggravating factors, patient tried a heating pad without much change. She states vaginal bleeding has persisted with wiping, she is utilizing a pad, but has not had any blood on the pad. She has not had a period in 2 years therefore this is abnormal for her. States she is somewhat nauseated without vomiting. Denies fever, chills, vomiting, diarrhea, blood in stool, dysuria, urgency, frequency, vaginal discharge, or concern for STD. She called her PCP and was instructed to come to ED for evaluation. She was informed she had an ovarian cyst on a CT scan previously and is unsure if this could be related.   HPI  Past Medical History:  Diagnosis Date  . Depression   . Diabetes mellitus   . Hyperlipidemia   . Hypokalemia   . Kidney stones   . Ovarian cyst     There are no active problems to display for this patient.   Past Surgical History:  Procedure Laterality Date  . LITHOTRIPSY     89- kidney stone sx     OB History   None      Home Medications    Prior to Admission medications   Medication Sig Start Date End Date Taking? Authorizing Provider  ezetimibe  (ZETIA) 10 MG tablet Take 10 mg by mouth daily.   Yes [provider]  Insulin Degludec (TRESIBA [place]) Inject 60 Units into the skin every morning.   Yes [provider]  metFORMIN (GLUCOPHAGE) 500 MG tablet Take by mouth 2 (two) times daily with a meal.   Yes [provider]  Semaglutide (OZEMPIC, 1 MG/DOSE, [place]) Inject 1 Units into the skin once a week.   Yes [provider]  aspirin 81 MG tablet Take 81 mg by mouth daily.    [provider]  buPROPion (WELLBUTRIN XL) 150 MG 24 hr tablet Take 150 mg by mouth daily.    [provider]  cephALEXin (KEFLEX) 500 MG capsule Take 1 capsule (500 mg total) by mouth 3 (three) times daily. 03/27/14   Orpah Greek, MD  citalopram (CELEXA) 20 MG tablet Take 20 mg by mouth daily.    [provider]  glipiZIDE (GLUCOTROL) 5 MG tablet Take 10 mg by mouth 2 (two) times daily before a meal.     [provider]  indapamide (LOZOL) 2.5 MG tablet Take 5 mg by mouth daily.     [provider]  insulin glargine (LANTUS) 100 UNIT/ML injection Inject 35 Units into the skin at bedtime.    [provider]  lisinopril (PRINIVIL,ZESTRIL) 5 MG tablet Take 10 mg by mouth daily.     [provider]  pantoprazole (PROTONIX) 40  MG tablet Take 40 mg by mouth daily.    [provider]  potassium citrate (UROCIT-K) 10 MEQ (1080 MG) SR tablet Take 10 mEq by mouth 3 (three) times daily with meals.    [provider]  predniSONE (DELTASONE) 20 MG tablet 3 tabs po day one, then 2 po daily x 4 days 09/20/17   Noe Gens, PA-C  rosuvastatin (CRESTOR) 40 MG tablet Take 40 mg by mouth daily.    [provider]  sitaGLIPtin-metformin (JANUMET) 50-1000 MG per tablet Take 1 tablet by mouth 2 (two) times daily with a meal.    [provider]    Family History Family History  Problem Relation Age of Onset  . Atrial fibrillation Mother     Social  History Social History   Tobacco Use  . Smoking status: Never Smoker  . Smokeless tobacco: Never Used  Substance Use Topics  . Alcohol use: No  . Drug use: No     Allergies   Bactrim [sulfamethoxazole-trimethoprim]; Caffeine; and Morphine and related   Review of Systems Review of Systems  Constitutional: Negative for chills and fever.  Respiratory: Negative for shortness of breath.   Cardiovascular: Negative for chest pain.  Gastrointestinal: Positive for abdominal pain and nausea. Negative for blood in stool, constipation, diarrhea and vomiting.  Genitourinary: Positive for pelvic pain and vaginal bleeding. Negative for dysuria, frequency, urgency and vaginal discharge.  All other systems reviewed and are negative.    Physical Exam Updated Vital Signs BP (!) 144/103 (BP Location: Left Arm)   Pulse 76   Temp 98.4 F (36.9 C) (Oral)   Resp 18   Ht 5\' 5"  (1.651 m)   Wt 97.1 kg   LMP 08/03/2016   SpO2 98%   BMI 35.61 kg/m   Physical Exam  Constitutional: She appears well-developed and well-nourished.  Non-toxic appearance. No distress.  HENT:  Head: Normocephalic and atraumatic.  Eyes: Conjunctivae are normal. Right eye exhibits no discharge. Left eye exhibits no discharge.  Neck: Neck supple.  Cardiovascular: Normal rate and regular rhythm.  Pulmonary/Chest: Effort normal and breath sounds normal. No respiratory distress. She has no wheezes. She has no rhonchi. She has no rales.  Respiration even and unlabored  Abdominal: Soft. She exhibits no distension. There is tenderness (lower abdomen ). There is no rigidity, no rebound, no guarding, no CVA tenderness, no tenderness at McBurney's point and negative Murphy's sign.  Genitourinary: Pelvic exam was performed with patient supine. There is no rash or tenderness on the right labia. There is no rash or tenderness on the left labia. Right adnexum displays no mass. Left adnexum displays no mass. There is bleeding (mild  amount from cervical os) in the vagina. No foreign body in the vagina. No vaginal discharge found.  Genitourinary Comments: Diffuse tenderness throughout pelvic exam. RN Vincente Liberty present as chaperone.   Neurological: She is alert.  Clear speech.   Skin: Skin is warm and dry. No rash noted.  Psychiatric: She has a normal mood and affect. Her behavior is normal.  Nursing note and vitals reviewed.   ED Treatments / Results  Labs Results for orders placed or performed during the hospital encounter of 06/24/18  Wet prep, genital  Result Value Ref Range   Yeast Wet Prep HPF POC PRESENT (A) NONE SEEN   Trich, Wet Prep NONE SEEN NONE SEEN   Clue Cells Wet Prep HPF POC NONE SEEN NONE SEEN   WBC, Wet Prep HPF POC MANY (A)  NONE SEEN   Sperm NONE SEEN   Lipase, blood  Result Value Ref Range   Lipase 49 11 - 51 U/L  Comprehensive metabolic panel  Result Value Ref Range   Sodium 138 135 - 145 mmol/L   Potassium 3.3 (L) 3.5 - 5.1 mmol/L   Chloride 103 98 - 111 mmol/L   CO2 28 22 - 32 mmol/L   Glucose, Bld 118 (H) 70 - 99 mg/dL   BUN 12 6 - 20 mg/dL   Creatinine, Ser 0.77 0.44 - 1.00 mg/dL   Calcium 8.9 8.9 - 10.3 mg/dL   Total Protein 7.0 6.5 - 8.1 g/dL   Albumin 3.8 3.5 - 5.0 g/dL   AST 16 15 - 41 U/L   ALT 22 0 - 44 U/L   Alkaline Phosphatase 58 38 - 126 U/L   Total Bilirubin 1.0 0.3 - 1.2 mg/dL   GFR calc non Af Amer >60 >60 mL/min   GFR calc Af Amer >60 >60 mL/min   Anion gap 7 5 - 15  CBC  Result Value Ref Range   WBC 9.9 4.0 - 10.5 K/uL   RBC 5.27 (H) 3.87 - 5.11 MIL/uL   Hemoglobin 15.9 (H) 12.0 - 15.0 g/dL   HCT 44.8 36.0 - 46.0 %   MCV 85.0 78.0 - 100.0 fL   MCH 30.2 26.0 - 34.0 pg   MCHC 35.5 30.0 - 36.0 g/dL   RDW 13.4 11.5 - 15.5 %   Platelets 258 150 - 400 K/uL  Urinalysis, Routine w reflex microscopic  Result Value Ref Range   Color, Urine YELLOW YELLOW   APPearance CLEAR CLEAR   Specific Gravity, Urine 1.010 1.005 - 1.030   pH 7.0 5.0 - 8.0   Glucose, UA  NEGATIVE NEGATIVE mg/dL   Hgb urine dipstick NEGATIVE NEGATIVE   Bilirubin Urine NEGATIVE NEGATIVE   Ketones, ur NEGATIVE NEGATIVE mg/dL   Protein, ur NEGATIVE NEGATIVE mg/dL   Nitrite NEGATIVE NEGATIVE   Leukocytes, UA SMALL (A) NEGATIVE  Urinalysis, Microscopic (reflex)  Result Value Ref Range   RBC / HPF 0-5 0 - 5 RBC/hpf   WBC, UA 21-50 0 - 5 WBC/hpf   Bacteria, UA MANY (A) NONE SEEN   Squamous Epithelial / LPF 0-5 0 - 5   Mucus PRESENT     EKG None  Radiology US Transvaginal Non-ob  Result Date: 06/24/2018 CLINICAL DATA:  Pelvic pain, vaginal bleeding EXAM: TRANSABDOMINAL AND TRANSVAGINAL ULTRASOUND OF PELVIS DOPPLER ULTRASOUND OF OVARIES TECHNIQUE: Both transabdominal and transvaginal ultrasound examinations of the pelvis were performed. Transabdominal technique was performed for global imaging of the pelvis including uterus, ovaries, adnexal regions, and pelvic cul-de-sac. It was necessary to proceed with endovaginal exam following the transabdominal exam to visualize the uterus, endometrium, ovaries and adnexa. Color and duplex Doppler ultrasound was utilized to evaluate blood flow to the ovaries. COMPARISON:  None. FINDINGS: Uterus Measurements: 8.2 x 4.1 x 4.6 cm. No fibroids or other mass visualized. Endometrium Thickness: 5 mm in thickness.  No focal abnormality visualized. Right ovary Measurements: Not visualized.  No adnexal mass seen. Left ovary Measurements: 5.1 x 2.7 x 3.6 cm. Two small cysts within the left ovary measuring up to 3.5 and 2.4 cm. Pulsed Doppler evaluation of both ovaries demonstrates normal low-resistance arterial and venous waveforms. Other findings Trace free fluid in the pelvis. IMPRESSION: Small left ovarian cysts, the largest 3.5 cm. Arterial and venous blood flow seen within the left ovary. No evidence of torsion. Electronically Signed  By: Rolm Baptise M.D.   On: 06/24/2018 13:45   US Pelvis Complete  Result Date: 06/24/2018 CLINICAL DATA:  Pelvic  pain, vaginal bleeding EXAM: TRANSABDOMINAL AND TRANSVAGINAL ULTRASOUND OF PELVIS DOPPLER ULTRASOUND OF OVARIES TECHNIQUE: Both transabdominal and transvaginal ultrasound examinations of the pelvis were performed. Transabdominal technique was performed for global imaging of the pelvis including uterus, ovaries, adnexal regions, and pelvic cul-de-sac. It was necessary to proceed with endovaginal exam following the transabdominal exam to visualize the uterus, endometrium, ovaries and adnexa. Color and duplex Doppler ultrasound was utilized to evaluate blood flow to the ovaries. COMPARISON:  None. FINDINGS: Uterus Measurements: 8.2 x 4.1 x 4.6 cm. No fibroids or other mass visualized. Endometrium Thickness: 5 mm in thickness.  No focal abnormality visualized. Right ovary Measurements: Not visualized.  No adnexal mass seen. Left ovary Measurements: 5.1 x 2.7 x 3.6 cm. Two small cysts within the left ovary measuring up to 3.5 and 2.4 cm. Pulsed Doppler evaluation of both ovaries demonstrates normal low-resistance arterial and venous waveforms. Other findings Trace free fluid in the pelvis. IMPRESSION: Small left ovarian cysts, the largest 3.5 cm. Arterial and venous blood flow seen within the left ovary. No evidence of torsion. Electronically Signed   By: Rolm Baptise M.D.   On: 06/24/2018 13:45   [place] Art/ven Flow Abd Pelv Doppler  Result Date: 06/24/2018 CLINICAL DATA:  Pelvic pain, vaginal bleeding EXAM: TRANSABDOMINAL AND TRANSVAGINAL ULTRASOUND OF PELVIS DOPPLER ULTRASOUND OF OVARIES TECHNIQUE: Both transabdominal and transvaginal ultrasound examinations of the pelvis were performed. Transabdominal technique was performed for global imaging of the pelvis including uterus, ovaries, adnexal regions, and pelvic cul-de-sac. It was necessary to proceed with endovaginal exam following the transabdominal exam to visualize the uterus, endometrium, ovaries and adnexa. Color and duplex Doppler ultrasound was utilized to  evaluate blood flow to the ovaries. COMPARISON:  None. FINDINGS: Uterus Measurements: 8.2 x 4.1 x 4.6 cm. No fibroids or other mass visualized. Endometrium Thickness: 5 mm in thickness.  No focal abnormality visualized. Right ovary Measurements: Not visualized.  No adnexal mass seen. Left ovary Measurements: 5.1 x 2.7 x 3.6 cm. Two small cysts within the left ovary measuring up to 3.5 and 2.4 cm. Pulsed Doppler evaluation of both ovaries demonstrates normal low-resistance arterial and venous waveforms. Other findings Trace free fluid in the pelvis. IMPRESSION: Small left ovarian cysts, the largest 3.5 cm. Arterial and venous blood flow seen within the left ovary. No evidence of torsion. Electronically Signed   By: Rolm Baptise M.D.   On: 06/24/2018 13:45    Procedures Procedures (including critical care time)  Medications Ordered in ED Medications - No data to display   Initial Impression / Assessment and Plan / ED Course  I have reviewed the triage vital signs and the nursing notes.  Pertinent labs & imaging results that were available during my care of the patient were reviewed by me and considered in my medical decision making (see chart for details).   Patient presents to the emergency department with lower abdominal pain with associated vaginal bleeding.  Patient nontoxic-appearing, no apparent distress, vitals WNL with the exception of elevated BP, doubt HTN emergency.  Exam patient has lower abdominal/suprapubic tenderness to palpation as well as diffuse tenderness to palpation on bimanual exam.  She does have a small amount of blood coming from the cervical office.  Evaluated with labs and ultrasound.  Labs reviewed: No leukocytosis. No anemia. No significant electrolyte disturbance, mild hypokalemia at 3.3- diet recommendations provided.  Hyperglycemia at 118, hx of T2DM. Renal function, LFTs, and lipase WNL. Urinalysis with many bacteria- no urinary sxs to indicate UTI, she does have yeast  on wet prep- will treat with fluconazole, urine culture sent.   No peritoneal signs on abdominal exam to raise concern for appendicitis, cholecystitis, diverticulitis, or bowel perforation/obstruction. She is sexually active in a monogamous relationship with her husband of > 20 years, feel PID is less likely. Ultrasound with small left ovarian cysts, the largest being 3.5 cm. No evidence of torsion. Suspect this is cause of patient's pain, however given post menopausal vaginal bleeding feel it is important patient have gynecology follow up. Improved following administration of toradol, prescription for naproxen provided. I discussed results, treatment plan, need for follow-up, and return precautions with the patient. Provided opportunity for questions, patient confirmed understanding and is in agreement with plan.    Final Clinical Impressions(s) / ED Diagnoses   Final diagnoses:  Cyst of left ovary  Post-menopausal  Vaginal bleeding    ED Discharge Orders         Ordered    fluconazole (DIFLUCAN) 150 MG tablet  Daily     06/24/18 1437    naproxen (NAPROSYN) 500 MG tablet  2 times daily     06/24/18 1437           Copelan Maultsby, [place] R, PA-C 06/24/18 Culbertson, Ankit, MD 06/28/18 1551

## 2018-06-25 LAB — GC/CHLAMYDIA PROBE AMP (~~LOC~~) NOT AT ARMC
Chlamydia: NEGATIVE
Neisseria Gonorrhea: NEGATIVE

## 2018-06-26 LAB — URINE CULTURE

## 2018-10-16 ENCOUNTER — Emergency Department (HOSPITAL_BASED_OUTPATIENT_CLINIC_OR_DEPARTMENT_OTHER)
Admission: EM | Admit: 2018-10-16 | Discharge: 2018-10-16 | Disposition: A | Discharge status: Home / Self Care | Payer: Managed Care, Other (non HMO) | Attending: Emergency Medicine | Admitting: Emergency Medicine

## 2018-10-16 ENCOUNTER — Emergency Department (HOSPITAL_BASED_OUTPATIENT_CLINIC_OR_DEPARTMENT_OTHER): Payer: Managed Care, Other (non HMO)

## 2018-10-16 ENCOUNTER — Other Ambulatory Visit: Payer: Self-pay

## 2018-10-16 ENCOUNTER — Encounter (HOSPITAL_BASED_OUTPATIENT_CLINIC_OR_DEPARTMENT_OTHER): Payer: Self-pay | Admitting: Emergency Medicine

## 2018-10-16 DIAGNOSIS — I10 Essential (primary) hypertension: Secondary | ICD-10-CM | POA: Diagnosis present

## 2018-10-16 DIAGNOSIS — Z79899 Other long term (current) drug therapy: Secondary | ICD-10-CM | POA: Insufficient documentation

## 2018-10-16 DIAGNOSIS — E119 Type 2 diabetes mellitus without complications: Secondary | ICD-10-CM | POA: Diagnosis not present

## 2018-10-16 DIAGNOSIS — H539 Unspecified visual disturbance: Secondary | ICD-10-CM | POA: Diagnosis not present

## 2018-10-16 DIAGNOSIS — G4459 Other complicated headache syndrome: Secondary | ICD-10-CM

## 2018-10-16 DIAGNOSIS — Z7984 Long term (current) use of oral hypoglycemic drugs: Secondary | ICD-10-CM | POA: Insufficient documentation

## 2018-10-16 DIAGNOSIS — R51 Headache: Secondary | ICD-10-CM | POA: Diagnosis not present

## 2018-10-16 DIAGNOSIS — Z7982 Long term (current) use of aspirin: Secondary | ICD-10-CM | POA: Diagnosis not present

## 2018-10-16 LAB — CBC WITH DIFFERENTIAL/PLATELET
Abs Immature Granulocytes: 0.05 10*3/uL (ref 0.00–0.07)
Basophils Absolute: 0 10*3/uL (ref 0.0–0.1)
Basophils Relative: 1 %
Eosinophils Absolute: 0.2 10*3/uL (ref 0.0–0.5)
Eosinophils Relative: 3 %
HCT: 45.3 % (ref 36.0–46.0)
Hemoglobin: 15.4 g/dL — ABNORMAL HIGH (ref 12.0–15.0)
Immature Granulocytes: 1 %
Lymphocytes Relative: 34 %
Lymphs Abs: 2.8 10*3/uL (ref 0.7–4.0)
MCH: 29.4 pg (ref 26.0–34.0)
MCHC: 34 g/dL (ref 30.0–36.0)
MCV: 86.6 fL (ref 80.0–100.0)
Monocytes Absolute: 0.4 10*3/uL (ref 0.1–1.0)
Monocytes Relative: 5 %
Neutro Abs: 4.6 10*3/uL (ref 1.7–7.7)
Neutrophils Relative %: 56 %
Platelets: 258 10*3/uL (ref 150–400)
RBC: 5.23 MIL/uL — ABNORMAL HIGH (ref 3.87–5.11)
RDW: 12.1 % (ref 11.5–15.5)
WBC: 8.1 10*3/uL (ref 4.0–10.5)
nRBC: 0 % (ref 0.0–0.2)

## 2018-10-16 LAB — BASIC METABOLIC PANEL
Anion gap: 6 (ref 5–15)
BUN: 19 mg/dL (ref 6–20)
CO2: 28 mmol/L (ref 22–32)
Calcium: 9 mg/dL (ref 8.9–10.3)
Chloride: 102 mmol/L (ref 98–111)
Creatinine, Ser: 0.85 mg/dL (ref 0.44–1.00)
GFR calc Af Amer: >60 mL/min (ref >60)
GFR calc non Af Amer: >60 mL/min (ref >60)
Glucose, Bld: 201 mg/dL — ABNORMAL HIGH (ref 70–99)
Potassium: 3.7 mmol/L (ref 3.5–5.1)
Sodium: 136 mmol/L (ref 135–145)

## 2018-10-16 MED ORDER — METOCLOPRAMIDE HCL 5 MG/ML IJ SOLN
10.0000 mg | Freq: Once | INTRAMUSCULAR | Status: AC
  Administered 2018-10-16: 10 mg via INTRAVENOUS
  Filled 2018-10-16: qty 2

## 2018-10-16 MED ORDER — IOPAMIDOL (ISOVUE-370) INJECTION 76%
100.0000 mL | Freq: Once | INTRAVENOUS | Status: AC | PRN
  Administered 2018-10-16: 100 mL via INTRAVENOUS

## 2018-10-16 MED ORDER — DEXAMETHASONE SODIUM PHOSPHATE 10 MG/ML IJ SOLN
10.0000 mg | Freq: Once | INTRAMUSCULAR | Status: AC
  Administered 2018-10-16: 10 mg via INTRAVENOUS
  Filled 2018-10-16: qty 1

## 2018-10-16 MED ORDER — ACETAMINOPHEN 325 MG PO TABS
650.0000 mg | ORAL_TABLET | Freq: Once | ORAL | Status: AC
  Administered 2018-10-16: 650 mg via ORAL
  Filled 2018-10-16: qty 2

## 2018-10-16 MED ORDER — SODIUM CHLORIDE 0.9 % IV BOLUS
500.0000 mL | Freq: Once | INTRAVENOUS | Status: AC
  Administered 2018-10-16: 500 mL via INTRAVENOUS

## 2018-10-16 MED ORDER — DIPHENHYDRAMINE HCL 50 MG/ML IJ SOLN
12.5000 mg | Freq: Once | INTRAMUSCULAR | Status: AC
  Administered 2018-10-16: 12.5 mg via INTRAVENOUS
  Filled 2018-10-16: qty 1

## 2018-10-16 NOTE — ED Triage Notes (Signed)
bp at home last night 185/101.  Had HA and visual changes.  Called pmd this morning and he could not see her but told her she needed to be seen. Sts she still has a blurry spot in her visual field of left eye.

## 2018-10-16 NOTE — ED Provider Notes (Signed)
[facility] EMERGENCY DEPARTMENT Provider Note   CSN: [id number] Arrival date & time: 10/16/18  7628     History   Chief Complaint Chief Complaint  Patient presents with  . Hypertension    HPI Catherine Marshall is a 48 y.o. female who presents with a headache, hypertension, and vision change. PMH significant for chronic migraines, HTN, hx of kidney stones, HLD. She states that she had kidney surgery on Jan 16th and had a uretal stent placed. The stent was removed last Friday. On Monday she started to have a global headache that felt like a pressure and she "felt weird". She also noticed a "spot" in the middle of her vision of her left eye and her vision was very blurry. This prompted her to check her blood pressure which was in the 180s/100. She took an extra dose of blood pressure medicine and her BP improved as well as her symptoms but they didn't resolve. Today she didn't feel better so she called her primary doctor and they advised her to come to the ED.  She denies dizziness, syncope, numbness, unilateral weakness, chest pain or shortness of breath  HPI  Past Medical History:  Diagnosis Date  . Depression   . Diabetes mellitus   . Hyperlipidemia   . Hypokalemia   . Kidney stones   . Ovarian cyst     There are no active problems to display for this patient.   Past Surgical History:  Procedure Laterality Date  . LITHOTRIPSY     89- kidney stone sx     OB History   No obstetric history on file.      Home Medications    Prior to Admission medications   Medication Sig Start Date End Date Taking? Authorizing Provider  SUMAtriptan (IMITREX) 100 MG tablet Take by mouth. 11/22/17 11/23/18 Yes [provider]  aspirin 81 MG tablet Take 81 mg by mouth daily.    [provider]  buPROPion (WELLBUTRIN XL) 150 MG 24 hr tablet Take 150 mg by mouth daily.    [provider]  citalopram (CELEXA) 20 MG tablet Take 20 mg by mouth daily.    [provider]  ezetimibe (ZETIA) 10 MG tablet Take 10 mg by mouth daily.    [provider]  fluconazole (DIFLUCAN) 150 MG tablet Take 1 tablet (150 mg total) by mouth daily. 06/24/18   Petrucelli, Samantha R, PA-C  Insulin Degludec (TRESIBA [place]) Inject 60 Units into the skin every morning.    [provider]  lisinopril (PRINIVIL,ZESTRIL) 5 MG tablet Take 10 mg by mouth daily.     [provider]  metFORMIN (GLUCOPHAGE) 500 MG tablet Take by mouth 2 (two) times daily with a meal.    [provider]  naproxen (NAPROSYN) 500 MG tablet Take 1 tablet (500 mg total) by mouth 2 (two) times daily. 06/24/18   Petrucelli, Samantha R, PA-C  pantoprazole (PROTONIX) 40 MG tablet Take 40 mg by mouth daily.    [provider]  potassium citrate (UROCIT-K) 10 MEQ (1080 MG) SR tablet Take 10 mEq by mouth 3 (three) times daily with meals.    [provider]  Semaglutide (OZEMPIC, 1 MG/DOSE, [place]) Inject 1 Units into the skin once a week.    [provider]    Family History Family History  Problem Relation Age of Onset  . Atrial fibrillation Mother     Social History Social History   Tobacco Use  . Smoking  status: Never Smoker  . Smokeless tobacco: Never Used  Substance Use Topics  . Alcohol use: No  . Drug use: No     Allergies   Bactrim [sulfamethoxazole-trimethoprim]; Caffeine; and Morphine and related   Review of Systems Review of Systems  Eyes: Positive for visual disturbance.  Respiratory: Negative for shortness of breath.   Cardiovascular: Negative for chest pain.  Neurological: Positive for headaches. Negative for dizziness, tremors, syncope, speech difficulty, weakness, light-headedness and numbness.  All other systems reviewed and are negative.    Physical Exam Updated Vital Signs BP (!) 141/84 (BP Location: Right Arm)   Pulse 64   Temp 98.7 F (37.1 C) (Oral)   Resp 15   Ht 5\' 4"  (1.626 m)   Wt 93.9 kg   LMP  08/03/2016   SpO2 99%   BMI 35.53 kg/m   Physical Exam Vitals signs and nursing note reviewed.  Constitutional:      General: She is not in acute distress.    Appearance: Normal appearance. She is well-developed.     Comments: Calm and cooperative  HENT:     Head: Normocephalic and atraumatic.  Eyes:     General: No scleral icterus.       Right eye: No discharge.        Left eye: No discharge.     Conjunctiva/sclera: Conjunctivae normal.     Pupils: Pupils are equal, round, and reactive to light.  Neck:     Musculoskeletal: Normal range of motion.  Cardiovascular:     Rate and Rhythm: Normal rate.  Pulmonary:     Effort: Pulmonary effort is normal. No respiratory distress.  Abdominal:     General: There is no distension.  Skin:    General: Skin is warm and dry.  Neurological:     Mental Status: She is alert and oriented to person, place, and time.     Comments: Mental Status:  Alert, oriented, thought content appropriate, able to give a coherent history. Speech fluent without evidence of aphasia. Able to follow 2 step commands without difficulty.  Cranial Nerves:  II:  Peripheral visual fields grossly normal, pupils equal, round, reactive to light III,IV, VI: ptosis not present, extra-ocular motions intact bilaterally  V,VII: smile symmetric, facial light touch sensation equal VIII: hearing grossly normal to voice  X: uvula elevates symmetrically  XI: bilateral shoulder shrug symmetric and strong XII: midline tongue extension without fassiculations Motor:  Normal tone. 5/5 in upper and lower extremities bilaterally including strong and equal grip strength and dorsiflexion/plantar flexion Sensory: Pinprick and light touch normal in all extremities.  Cerebellar: normal finger-to-nose with bilateral upper extremities Gait: normal gait and balance CV: distal pulses palpable throughout    Psychiatric:        Behavior: Behavior normal.      ED Treatments / Results    Labs (all labs ordered are listed, but only abnormal results are displayed) Labs Reviewed  BASIC METABOLIC PANEL - Abnormal; Notable for the following components:      Result Value   Glucose, Bld 201 (*)    All other components within normal limits  CBC WITH DIFFERENTIAL/PLATELET - Abnormal; Notable for the following components:   RBC 5.23 (*)    Hemoglobin 15.4 (*)    All other components within normal limits    EKG None  Radiology Ct Angio Head W Or Wo Contrast  Result Date: 10/16/2018 CLINICAL DATA:  Headache. Blurred vision left eye. Symptoms began last night. EXAM: CT  ANGIOGRAPHY HEAD TECHNIQUE: Multidetector CT imaging of the head was performed using the standard protocol during bolus administration of intravenous contrast. Multiplanar CT image reconstructions and MIPs were obtained to evaluate the vascular anatomy. CONTRAST:  19mL ISOVUE-370 IOPAMIDOL (ISOVUE-370) INJECTION 76% COMPARISON:  None. FINDINGS: CT HEAD Brain: The brain shows a normal appearance without evidence of malformation, atrophy, old or acute small or large vessel infarction, mass lesion, hemorrhage, hydrocephalus or extra-axial collection. Vascular: No hyperdense vessel. No evidence of atherosclerotic calcification. Skull: Normal. No traumatic finding. No focal bone lesion. Sinuses/Orbits: Sinuses are clear. Orbits appear normal. Mastoids are clear. Other: None significant CTA HEAD Anterior circulation: Both internal carotid arteries widely patent through the skull base and siphon regions. The anterior and middle cerebral vessels are patent without proximal stenosis, aneurysm or vascular malformation. No missing branch vessels are identified. Posterior circulation: Both vertebral arteries are small vessels patent through the foramen magnum to the basilar. The basilar artery is a small vessel which is patent through the superior cerebellar arteries. Both posterior cerebral arteries receive there supply from the  anterior circulation. Venous sinuses: Patent and normal. Anatomic variants: None other significant Delayed phase: No abnormal enhancement. IMPRESSION: Normal examination. No cause of the presenting symptoms is identified. Normal head CT. No evidence of atherosclerotic disease, dissection or vascular occlusion. 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 Electronically Signed   By: Nelson Chimes M.D.   On: 10/16/2018 13:24    Procedures Procedures (including critical care time)  Medications Ordered in ED Medications  metoCLOPramide (REGLAN) injection 10 mg (10 mg Intravenous Given 10/16/18 1156)  diphenhydrAMINE (BENADRYL) injection 12.5 mg (12.5 mg Intravenous Given 10/16/18 1201)  dexamethasone (DECADRON) injection 10 mg (10 mg Intravenous Given 10/16/18 1200)  sodium chloride 0.9 % bolus 500 mL (0 mLs Intravenous Stopped 10/16/18 1337)  acetaminophen (TYLENOL) tablet 650 mg (650 mg Oral Given 10/16/18 1202)  iopamidol (ISOVUE-370) 76 % injection 100 mL (100 mLs Intravenous Contrast Given 10/16/18 1247)     Initial Impression / Assessment and Plan / ED Course  I have reviewed the triage vital signs and the nursing notes.  Pertinent labs & imaging results that were available during my care of the patient were reviewed by me and considered in my medical decision making (see chart for details).  48 year old female presents with headache and visual field change along with hypertension.  Her blood pressure is not severely elevated here but is consistently 782N and 562Z systolic.  Her neurologic exam is nonfocal although she does complain of ongoing headache and seen a spot in the left eye.  Shared visit with Dr. Ralene Bathe.  Will obtain labs and CTA of the head.  We will also give a migraine cocktail to see if this is possibly complex migraine.  CTA is negative.  On reevaluation the patient states that her  headache and vision changes have resolved.  She is asking about her blood pressure.  I advised her to follow-up with her doctor regarding this and to keep a log of her blood pressures.  She verbalized understanding.  She was given strict return precautions.  Final Clinical Impressions(s) / ED Diagnoses   Final diagnoses:  Hypertension, unspecified type  Other complicated headache syndrome  Change in vision    ED Discharge Orders    None       Recardo Evangelist, PA-C 10/16/18 1749    Quintella Reichert, MD 10/17/18 1007

## 2018-10-16 NOTE — Discharge Instructions (Signed)
Please keep a log of your blood pressures Follow up with your doctor Return if worsening

## 2019-05-21 ENCOUNTER — Other Ambulatory Visit: Payer: Self-pay

## 2019-05-21 ENCOUNTER — Emergency Department (INDEPENDENT_AMBULATORY_CARE_PROVIDER_SITE_OTHER)
Admission: EM | Admit: 2019-05-21 | Discharge: 2019-05-21 | Disposition: A | Discharge status: Home / Self Care | Payer: Managed Care, Other (non HMO) | Source: Home / Self Care

## 2019-05-21 DIAGNOSIS — R300 Dysuria: Secondary | ICD-10-CM | POA: Diagnosis not present

## 2019-05-21 DIAGNOSIS — N3001 Acute cystitis with hematuria: Secondary | ICD-10-CM

## 2019-05-21 LAB — POCT URINALYSIS DIP (MANUAL ENTRY)
Bilirubin, UA: NEGATIVE
Glucose, UA: NEGATIVE mg/dL
Ketones, POC UA: NEGATIVE mg/dL
Nitrite, UA: NEGATIVE
Spec Grav, UA: 1.025 (ref 1.010–1.025)
Urobilinogen, UA: 0.2 E.U./dL
pH, UA: 6 (ref 5.0–8.0)

## 2019-05-21 MED ORDER — CEPHALEXIN 500 MG PO CAPS: 500.0000 mg | ORAL_CAPSULE | Freq: Two times a day (BID) | ORAL | 0 refills | Status: DC

## 2019-05-21 NOTE — Discharge Instructions (Signed)
°  Please take your antibiotic as prescribed. A urine culture has been sent to check the severity of your urinary infection and to determine if you are on the most appropriate antibiotic. The results should come back within 2-3 days and you will be notified if a change in medication is indicated.   Please stay well hydrated and follow up with your family doctor in 1 week if not improving, sooner if worsening.

## 2019-05-21 NOTE — ED Provider Notes (Signed)
[name]    CSN: [id number] Arrival date & time: 05/21/19  [place]      History   Chief Complaint Chief Complaint  Patient presents with  . Dysuria    HPI Letita Mkrtchyan is a 48 y.o. female.   HPI  Shunda Hoerner is a 48 y.o. female presenting to UC with c/o 2 days of gradually worsening urinary symptoms with burning with urination and frequency.  Worse this morning.  Denies fever, chills, n/v/d. Mild lower abdominal pain but no back pain. Hx of UTIs in the past, symptoms feel similar.   She does have a hx of kidney stones as well. No medication tried PTA.   Past Medical History:  Diagnosis Date  . Depression   . Diabetes mellitus   . Hyperlipidemia   . Hypokalemia   . Kidney stones   . Ovarian cyst     There are no active problems to display for this patient.   Past Surgical History:  Procedure Laterality Date  . LITHOTRIPSY     89- kidney stone sx    OB History   No obstetric history on file.      Home Medications    Prior to Admission medications   Medication Sig Start Date End Date Taking? Authorizing Provider  aspirin 81 MG tablet Take 81 mg by mouth daily.    [provider]  buPROPion (WELLBUTRIN XL) 150 MG 24 hr tablet Take 150 mg by mouth daily.    [provider]  cephALEXin (KEFLEX) 500 MG capsule Take 1 capsule (500 mg total) by mouth 2 (two) times daily. 05/21/19   Noe Gens, PA-C  citalopram (CELEXA) 20 MG tablet Take 20 mg by mouth daily.    [provider]  ezetimibe (ZETIA) 10 MG tablet Take 10 mg by mouth daily.    [provider]  fluconazole (DIFLUCAN) 150 MG tablet Take 1 tablet (150 mg total) by mouth daily. 06/24/18   Petrucelli, Samantha R, PA-C  Insulin Degludec (TRESIBA [place]) Inject 60 Units into the skin every morning.    [provider]  lisinopril (PRINIVIL,ZESTRIL) 5 MG tablet Take 10 mg by mouth daily.     [provider]  metFORMIN (GLUCOPHAGE) 500 MG tablet Take by  mouth 2 (two) times daily with a meal.    [provider]  naproxen (NAPROSYN) 500 MG tablet Take 1 tablet (500 mg total) by mouth 2 (two) times daily. 06/24/18   Petrucelli, Samantha R, PA-C  pantoprazole (PROTONIX) 40 MG tablet Take 40 mg by mouth daily.    [provider]  potassium citrate (UROCIT-K) 10 MEQ (1080 MG) SR tablet Take 10 mEq by mouth 3 (three) times daily with meals.    [provider]  Semaglutide (OZEMPIC, 1 MG/DOSE, [place]) Inject 1 Units into the skin once a week.    [provider]  SUMAtriptan (IMITREX) 100 MG tablet Take by mouth. 11/22/17 11/23/18  [provider]    Family History Family History  Problem Relation Age of Onset  . Atrial fibrillation Mother     Social History Social History   Tobacco Use  . Smoking status: Never Smoker  . Smokeless tobacco: Never Used  Substance Use Topics  . Alcohol use: No  . Drug use: No     Allergies   Bactrim [sulfamethoxazole-trimethoprim], Caffeine, and Morphine and related   Review of Systems Review of Systems  Constitutional: Negative for chills and fever.  Gastrointestinal: Positive for abdominal pain (  lower, bladder). Negative for diarrhea, nausea and vomiting.  Genitourinary: Positive for dysuria, frequency and urgency. Negative for flank pain, vaginal bleeding, vaginal discharge and vaginal pain.  Musculoskeletal: Negative for back pain.     Physical Exam Triage Vital Signs ED Triage Vitals  Enc Vitals Group     BP 05/21/19 1850 140/87     Pulse Rate 05/21/19 1850 76     Resp 05/21/19 1850 20     Temp 05/21/19 1850 98.1 F (36.7 C)     Temp Source 05/21/19 1850 Oral     SpO2 05/21/19 1850 97 %     Weight 05/21/19 1851 222 lb (100.7 kg)     Height 05/21/19 1851 5\' 5"  (1.651 m)     Head Circumference --      Peak Flow --      Pain Score 05/21/19 1850 7     Pain Loc --      Pain Edu? --      Excl. in [place]? --    No data found.  Updated Vital Signs BP  140/87 (BP Location: Right Arm)   Pulse 76   Temp 98.1 F (36.7 C) (Oral)   Resp 20   Ht 5\' 5"  (1.651 m)   Wt 222 lb (100.7 kg)   LMP 08/03/2016   SpO2 97%   BMI 36.94 kg/m   Visual Acuity Right Eye Distance:   Left Eye Distance:   Bilateral Distance:    Right Eye Near:   Left Eye Near:    Bilateral Near:     Physical Exam Vitals signs and nursing note reviewed.  Constitutional:      Appearance: Normal appearance. She is well-developed.  HENT:     Head: Normocephalic and atraumatic.     Mouth/Throat:     Mouth: Mucous membranes are moist.  Neck:     Musculoskeletal: Normal range of motion.  Cardiovascular:     Rate and Rhythm: Normal rate and regular rhythm.  Pulmonary:     Effort: Pulmonary effort is normal.     Breath sounds: Normal breath sounds.  Abdominal:     General: There is no distension.     Palpations: Abdomen is soft.     Tenderness: There is abdominal tenderness (mild, suprapubic). There is no right CVA tenderness or left CVA tenderness.  Musculoskeletal: Normal range of motion.  Skin:    General: Skin is warm and dry.  Neurological:     Mental Status: She is alert and oriented to person, place, and time.  Psychiatric:        Behavior: Behavior normal.      UC Treatments / Results  Labs (all labs ordered are listed, but only abnormal results are displayed) Labs Reviewed  POCT URINALYSIS DIP (MANUAL ENTRY) - Abnormal; Notable for the following components:      Result Value   Clarity, UA cloudy (*)    Blood, UA moderate (*)    Protein Ur, POC trace (*)    Leukocytes, UA Small (1+) (*)    All other components within normal limits  URINE CULTURE    EKG   Radiology No results found.  Procedures Procedures (including critical care time)  Medications Ordered in UC Medications - No data to display  Initial Impression / Assessment and Plan / UC Course  I have reviewed the triage vital signs and the nursing notes.  Pertinent labs &  imaging results that were available during my care of the patient were reviewed  by me and considered in my medical decision making (see chart for details).     Hx and UA c/w UTI Will start on Keflex while culture pending  Final Clinical Impressions(s) / UC Diagnoses   Final diagnoses:  Dysuria  Acute cystitis with hematuria     Discharge Instructions      Please take your antibiotic as prescribed. A urine culture has been sent to check the severity of your urinary infection and to determine if you are on the most appropriate antibiotic. The results should come back within 2-3 days and you will be notified if a change in medication is indicated.   Please stay well hydrated and follow up with your family doctor in 1 week if not improving, sooner if worsening.     ED Prescriptions    Medication Sig Dispense Auth. Provider   cephALEXin (KEFLEX) 500 MG capsule Take 1 capsule (500 mg total) by mouth 2 (two) times daily. 14 capsule Noe Gens, PA-C     Controlled Substance Prescriptions [place] Controlled Substance Registry consulted? Not Applicable   Tyrell Antonio 05/24/19 [id number]

## 2019-05-21 NOTE — ED Triage Notes (Signed)
Pt started having urinary sx 2 days ago, but became much worse this am.  Having burning and urinary frequency today.

## 2019-05-23 LAB — URINE CULTURE
MICRO NUMBER:: 834813
SPECIMEN QUALITY:: ADEQUATE

## 2019-05-26 ENCOUNTER — Telehealth: Payer: Self-pay | Admitting: Emergency Medicine

## 2019-05-26 NOTE — Telephone Encounter (Signed)
Patient states she is improving; gave her culture results; she will take remainder of her rx.

## 2019-06-29 ENCOUNTER — Other Ambulatory Visit: Payer: Self-pay

## 2019-06-29 ENCOUNTER — Emergency Department (INDEPENDENT_AMBULATORY_CARE_PROVIDER_SITE_OTHER)
Admission: EM | Admit: 2019-06-29 | Discharge: 2019-06-29 | Disposition: A | Discharge status: Home / Self Care | Payer: Managed Care, Other (non HMO) | Source: Home / Self Care | Attending: Emergency Medicine | Admitting: Emergency Medicine

## 2019-06-29 DIAGNOSIS — M62830 Muscle spasm of back: Secondary | ICD-10-CM | POA: Diagnosis not present

## 2019-06-29 DIAGNOSIS — M549 Dorsalgia, unspecified: Secondary | ICD-10-CM

## 2019-06-29 MED ORDER — MELOXICAM 7.5 MG PO TABS: ORAL_TABLET | ORAL | 0 refills | Status: AC

## 2019-06-29 MED ORDER — CYCLOBENZAPRINE HCL 10 MG PO TABS: 10.0000 mg | ORAL_TABLET | Freq: Every day | ORAL | 0 refills | Status: AC

## 2019-06-29 NOTE — Discharge Instructions (Addendum)
Based on your history and physical exam, diagnosis is deep muscle strain with muscle spasm right upper back muscle.  (You have deep tenderness palpation of the muscle right upper back)  you also have an element of costochondritis, which is inflammation of the cartilage and muscle around the right side of the front of your chest. There is no sign of a blood clot and your history is not consistent with heart or lung cause for your symptoms. Treatment is anti-inflammatory pain med.  Meloxicam twice a day.  And muscle relaxant at bedtime.  Prescription sent to your pharmacy. Also heat.  May try alternating ice with heat if that helps. Follow-up with PCP if not better in 5 to 7 days.  If you have any severe worsening symptoms in the meantime, go to emergency room.

## 2019-06-29 NOTE — ED Provider Notes (Signed)
[name]    CSN: [id number] Arrival date & time: 06/29/19  1420      History   Chief Complaint Chief Complaint  Patient presents with   Back Pain    right upper    HPI Catherine Marshall is a 48 y.o. female.     Awoke this AM and sat up in bed then developed sharp right upper back pain that "goes to my right chest", painful to take a deep breath.  Took 800mg  IBF with some relief.  The pain was initially 7 out of 10, currently 3 out of 10.  Denies anterior chest pain or left-sided chest pain or shortness of breath or focal neurologic symptoms or lightheadedness or nausea or vomiting or fever or chills or diarrhea or abdominal pain. Denies flank pain or hematuria or any urinary symptoms. She does not recall any specific injury. Past Medical History:  Diagnosis Date   Depression    Diabetes mellitus    Hyperlipidemia    Hypokalemia    Kidney stones    Ovarian cyst     There are no active problems to display for this patient.   Past Surgical History:  Procedure Laterality Date   LITHOTRIPSY     56- kidney stone sx    OB History   No obstetric history on file.      Home Medications    Prior to Admission medications   Medication Sig Start Date End Date Taking? Authorizing Provider  insulin lispro protamine-lispro (HUMALOG 75/25 MIX) (75-25) 100 UNIT/ML SUSP injection Inject into the skin.   Yes [provider]  aspirin 81 MG tablet Take 81 mg by mouth daily.    [provider]  buPROPion (WELLBUTRIN XL) 150 MG 24 hr tablet Take 150 mg by mouth daily.    [provider]  citalopram (CELEXA) 20 MG tablet Take 20 mg by mouth daily.    [provider]  cyclobenzaprine (FLEXERIL) 10 MG tablet Take 1 tablet (10 mg total) by mouth at bedtime. For muscle relaxant 06/29/19   Jacqulyn Cane, MD  ezetimibe (ZETIA) 10 MG tablet Take 10 mg by mouth daily.    [provider]  lisinopril (PRINIVIL,ZESTRIL) 5 MG  tablet Take 10 mg by mouth daily.     [provider]  meloxicam (MOBIC) 7.5 MG tablet Take 1 twice a day as needed for pain. Take with food. (Do not take with any other NSAID.) 06/29/19   Jacqulyn Cane, MD  metFORMIN (GLUCOPHAGE) 500 MG tablet Take by mouth 2 (two) times daily with a meal.    [provider]  naproxen (NAPROSYN) 500 MG tablet Take 1 tablet (500 mg total) by mouth 2 (two) times daily. 06/24/18   Petrucelli, Samantha R, PA-C  potassium citrate (UROCIT-K) 10 MEQ (1080 MG) SR tablet Take 10 mEq by mouth 3 (three) times daily with meals.    [provider]  Semaglutide (OZEMPIC, 1 MG/DOSE, [place]) Inject 1 Units into the skin once a week.    [provider]  SUMAtriptan (IMITREX) 100 MG tablet Take by mouth. 11/22/17 11/23/18  [provider]    Family History Family History  Problem Relation Age of Onset   Atrial fibrillation Mother     Social History Social History   Tobacco Use   Smoking status: Never Smoker   Smokeless tobacco: Never Used  Substance Use Topics   Alcohol use: No   Drug use: No     Allergies   Bactrim [  sulfamethoxazole-trimethoprim], Caffeine, and Morphine and related   Review of Systems Review of Systems  All other systems reviewed and are negative.  Pertinent items noted in HPI and remainder of comprehensive ROS otherwise negative.   Physical Exam Triage Vital Signs ED Triage Vitals  Enc Vitals Group     BP 06/29/19 1506 124/77     Pulse Rate 06/29/19 1506 75     Resp 06/29/19 1506 18     Temp 06/29/19 1506 98.8 F (37.1 C)     Temp Source 06/29/19 1506 Oral     SpO2 06/29/19 1506 99 %     Weight 06/29/19 1507 227 lb (103 kg)     Height 06/29/19 1507 5\' 5"  (1.651 m)     Head Circumference --      Peak Flow --      Pain Score 06/29/19 1506 7     Pain Loc --      Pain Edu? --      Excl. in [place]? --    No data found.  Updated Vital Signs BP 124/77 (BP Location: Right Arm)    Pulse 75     Temp 98.8 F (37.1 C) (Oral)    Resp 18    Ht 5\' 5"  (1.651 m)    Wt 103 kg    LMP 08/03/2016    SpO2 99%    BMI 37.77 kg/m    Physical Exam Vitals signs reviewed.  Constitutional:      General: She is not in acute distress.    Appearance: She is well-developed.  HENT:     Head: Normocephalic and atraumatic.  Eyes:     General: No scleral icterus.    Pupils: Pupils are equal, round, and reactive to light.  Neck:     Musculoskeletal: Normal range of motion and neck supple.  Nontender.  No masses.  Carotids 2+ and equal without bruits. Cardiovascular:     Rate and Rhythm: Normal rate and regular rhythm.  No murmur. Pulmonary:     Effort: Pulmonary effort is normal.  Lungs clear to auscultation bilaterally.  Breath sounds equal bilaterally.  Pulse ox 99% room air Back: She has specific severe point tenderness right parathoracic muscles, which exacerbate and mimic the exact back pain that she is having.  Minimally tender right costochondral junction.  Otherwise no chest wall or back deformity or tenderness.  No ecchymosis. Abdominal:     General: There is no distension.  Soft, nontender, no masses. Skin:    General: Skin is warm and dry.  No rash Neurological:     Mental Status: She is alert and oriented to person, place, and time.     Cranial Nerves: No cranial nerve deficit.  Psychiatric:        Behavior: Behavior normal.    UC Treatments / Results  Labs (all labs ordered are listed, but only abnormal results are displayed) Labs Reviewed - No data to display  EKG   Radiology No results found.  Procedures Procedures (including critical care time)  Medications Ordered in UC Medications - No data to display  Initial Impression / Assessment and Plan / UC Course  I have reviewed the triage vital signs and the nursing notes.  Pertinent labs & imaging results that were available during my care of the patient were reviewed by me and considered in my medical decision making  (see chart for details).      Final Clinical Impressions(s) / UC Diagnoses   Final diagnoses:  Muscle spasm of back  Upper back pain on right side  We discussed at length that based on history and physical exam diagnosis is deep muscle strain right upper back, with an element of costochondritis.-Especially because she has point tenderness right over the areas that she is complaining of the pain. We discussed the risks and benefits and pros and cons of CXR and EKG , and I suggested we do these to be on the safe side, but she declined chest x-ray and EKG today.   Discharge Instructions     Based on your history and physical exam, diagnosis is deep muscle strain with muscle spasm right upper back muscle.  (You have deep tenderness palpation of the muscle right upper back)  you also have an element of costochondritis, which is inflammation of the cartilage and muscle around the right side of the front of your chest. There is no sign of a blood clot and your history is not consistent with heart or lung cause for your symptoms. Treatment is anti-inflammatory pain med.  Meloxicam twice a day.  And muscle relaxant at bedtime.  Prescription sent to your pharmacy. Also heat.  May try alternating ice with heat if that helps. Follow-up with PCP if not better in 5 to 7 days.  If you have any severe worsening symptoms in the meantime, go to emergency room.      ED Prescriptions    Medication Sig Dispense Auth. Provider   meloxicam (MOBIC) 7.5 MG tablet Take 1 twice a day as needed for pain. Take with food. (Do not take with any other NSAID.) 20 tablet Jacqulyn Cane, MD   cyclobenzaprine (FLEXERIL) 10 MG tablet Take 1 tablet (10 mg total) by mouth at bedtime. For muscle relaxant 10 tablet Jacqulyn Cane, MD     PDMP not reviewed this encounter.   Jacqulyn Cane, MD 06/30/19 [phone]

## 2019-06-29 NOTE — ED Triage Notes (Signed)
Awoke this AM and sat up in bed then developed right upper back pain that "goes through to my chest". Painful to take a deep breath. Took 800mg  IBF with some relief.

## 2019-08-27 ENCOUNTER — Encounter: Payer: Self-pay | Admitting: Emergency Medicine

## 2019-08-27 ENCOUNTER — Other Ambulatory Visit: Payer: Self-pay

## 2019-08-27 ENCOUNTER — Emergency Department (INDEPENDENT_AMBULATORY_CARE_PROVIDER_SITE_OTHER)
Admission: EM | Admit: 2019-08-27 | Discharge: 2019-08-27 | Disposition: A | Discharge status: Home / Self Care | Payer: Managed Care, Other (non HMO) | Source: Home / Self Care

## 2019-08-27 DIAGNOSIS — R509 Fever, unspecified: Secondary | ICD-10-CM | POA: Diagnosis not present

## 2019-08-27 DIAGNOSIS — R05 Cough: Secondary | ICD-10-CM

## 2019-08-27 DIAGNOSIS — U071 COVID-19: Secondary | ICD-10-CM | POA: Diagnosis not present

## 2019-08-27 DIAGNOSIS — J029 Acute pharyngitis, unspecified: Secondary | ICD-10-CM

## 2019-08-27 LAB — POC SARS CORONAVIRUS 2 AG -  ED: SARS Coronavirus 2 Ag: POSITIVE — AB

## 2019-08-27 NOTE — Discharge Instructions (Signed)
Return if any problems.

## 2019-08-27 NOTE — ED Triage Notes (Signed)
Pt c/o fever, headache and cough since Sunday. Daughter tested positive today.

## 2019-08-27 NOTE — ED Provider Notes (Signed)
[name]    CSN: [id number] Arrival date & time: 08/27/19  1020      History   Chief Complaint Chief Complaint  Patient presents with   Fever    HPI Ryli Depetro is a 48 y.o. female.   The history is provided by the patient. No language interpreter was used.  Fever Max temp prior to arrival:  100 Temp source:  Oral Severity:  Moderate Onset quality:  Gradual Timing:  Constant Progression:  Worsening Chronicity:  New Relieved by:  Nothing Worsened by:  Nothing Associated symptoms: cough and sore throat   Pt exposed to a family member with allergies,  Pt began feeling sick yesterday  Past Medical History:  Diagnosis Date   Depression    Diabetes mellitus    Hyperlipidemia    Hypokalemia    Kidney stones    Ovarian cyst     There are no active problems to display for this patient.   Past Surgical History:  Procedure Laterality Date   LITHOTRIPSY     74- kidney stone sx    OB History   No obstetric history on file.      Home Medications    Prior to Admission medications   Medication Sig Start Date End Date Taking? Authorizing Provider  aspirin 81 MG tablet Take 81 mg by mouth daily.    [provider]  buPROPion (WELLBUTRIN XL) 150 MG 24 hr tablet Take 150 mg by mouth daily.    [provider]  citalopram (CELEXA) 20 MG tablet Take 20 mg by mouth daily.    [provider]  cyclobenzaprine (FLEXERIL) 10 MG tablet Take 1 tablet (10 mg total) by mouth at bedtime. For muscle relaxant 06/29/19   Jacqulyn Cane, MD  ezetimibe (ZETIA) 10 MG tablet Take 10 mg by mouth daily.    [provider]  insulin lispro protamine-lispro (HUMALOG 75/25 MIX) (75-25) 100 UNIT/ML SUSP injection Inject into the skin.    [provider]  lisinopril (PRINIVIL,ZESTRIL) 5 MG tablet Take 10 mg by mouth daily.     [provider]  meloxicam (MOBIC) 7.5 MG tablet Take 1 twice a day as needed for pain. Take  with food. (Do not take with any other NSAID.) 06/29/19   Jacqulyn Cane, MD  metFORMIN (GLUCOPHAGE) 500 MG tablet Take by mouth 2 (two) times daily with a meal.    [provider]  naproxen (NAPROSYN) 500 MG tablet Take 1 tablet (500 mg total) by mouth 2 (two) times daily. 06/24/18   Petrucelli, Samantha R, PA-C  potassium citrate (UROCIT-K) 10 MEQ (1080 MG) SR tablet Take 10 mEq by mouth 3 (three) times daily with meals.    [provider]  Semaglutide (OZEMPIC, 1 MG/DOSE, [place]) Inject 1 Units into the skin once a week.    [provider]  SUMAtriptan (IMITREX) 100 MG tablet Take by mouth. 11/22/17 11/23/18  [provider]    Family History Family History  Problem Relation Age of Onset   Atrial fibrillation Mother     Social History Social History   Tobacco Use   Smoking status: Never Smoker   Smokeless tobacco: Never Used  Substance Use Topics   Alcohol use: No   Drug use: No     Allergies   Bactrim [sulfamethoxazole-trimethoprim], Caffeine, and Morphine and related   Review of Systems Review of Systems  Constitutional: Positive for fever.  HENT: Positive for sore throat.   Respiratory: Positive for cough.  All other systems reviewed and are negative.    Physical Exam Triage Vital Signs ED Triage Vitals  Enc Vitals Group     BP 08/27/19 1053 127/80     Pulse Rate 08/27/19 1053 76     Resp --      Temp 08/27/19 1053 98.8 F (37.1 C)     Temp Source 08/27/19 1053 Oral     SpO2 08/27/19 1053 97 %     Weight --      Height --      Head Circumference --      Peak Flow --      Pain Score 08/27/19 1050 0     Pain Loc --      Pain Edu? --      Excl. in [place]? --    No data found.  Updated Vital Signs BP 127/80 (BP Location: Right Arm)    Pulse 76    Temp 98.8 F (37.1 C) (Oral)    LMP 08/03/2016    SpO2 97%   Visual Acuity Right Eye Distance:   Left Eye Distance:   Bilateral Distance:    Right Eye Near:   Left Eye Near:     Bilateral Near:     Physical Exam Vitals signs and nursing note reviewed.  Constitutional:      Appearance: She is well-developed.  HENT:     Head: Normocephalic.     Right Ear: Tympanic membrane normal.     Nose: Nose normal.     Mouth/Throat:     Mouth: Mucous membranes are moist.  Neck:     Musculoskeletal: Normal range of motion.  Pulmonary:     Effort: Pulmonary effort is normal.  Abdominal:     General: Abdomen is flat. There is no distension.  Musculoskeletal: Normal range of motion.  Skin:    General: Skin is warm.  Neurological:     General: No focal deficit present.     Mental Status: She is alert and oriented to person, place, and time.  Psychiatric:        Mood and Affect: Mood normal.      UC Treatments / Results  Labs (all labs ordered are listed, but only abnormal results are displayed) Labs Reviewed  POC SARS CORONAVIRUS 2 AG -  ED - Abnormal; Notable for the following components:      Result Value   SARS Coronavirus 2 Ag Positive (*)    All other components within normal limits  NOVEL CORONAVIRUS, NAA    EKG   Radiology No results found.  Procedures Procedures (including critical care time)  Medications Ordered in UC Medications - No data to display  Initial Impression / Assessment and Plan / UC Course  I have reviewed the triage vital signs and the nursing notes.  Pertinent labs & imaging results that were available during my care of the patient were reviewed by me and considered in my medical decision making (see chart for details).     COvid positive.  Pt advised tylenol every 4 hours.  Final Clinical Impressions(s) / UC Diagnoses   Final diagnoses:  [id number] virus detected     Discharge Instructions     Return if any problems.   An After Visit Summary was printed and given to the patient.  ED Prescriptions    None     PDMP not reviewed this encounter.   Fransico Meadow, [place] 08/27/19 1137

## 2019-08-29 LAB — NOVEL CORONAVIRUS, NAA: SARS-CoV-2, NAA: DETECTED — AB

## 2019-12-01 ENCOUNTER — Emergency Department (INDEPENDENT_AMBULATORY_CARE_PROVIDER_SITE_OTHER)
Admission: EM | Admit: 2019-12-01 | Discharge: 2019-12-01 | Disposition: A | Discharge status: Home / Self Care | Payer: PRIVATE HEALTH INSURANCE | Source: Home / Self Care

## 2019-12-01 ENCOUNTER — Encounter: Payer: Self-pay | Admitting: Emergency Medicine

## 2019-12-01 ENCOUNTER — Other Ambulatory Visit: Payer: Self-pay

## 2019-12-01 DIAGNOSIS — Z5189 Encounter for other specified aftercare: Secondary | ICD-10-CM | POA: Diagnosis not present

## 2019-12-01 HISTORY — DX: Malignant neoplasm of unspecified ovary: C56.9

## 2019-12-01 MED ORDER — MUPIROCIN CALCIUM 2 % NA OINT: TOPICAL_OINTMENT | NASAL | 0 refills | Status: AC

## 2019-12-01 NOTE — ED Provider Notes (Signed)
[facility]    CSN: [id number] Arrival date & time: 12/01/19  1314      History   Chief Complaint Chief Complaint  Patient presents with  . Post-op Problem    HPI Catherine Marshall is a 49 y.o. female.   Patient had midline abdominal surgery about 1 month ago.  She had absorbable sutures with glue on the skin.  There is an area around the umbilicus now that is red.  She has seen 1 suture work its way out.  There is been no drainage or fever in the area  HPI  Past Medical History:  Diagnosis Date  . Depression   . Diabetes mellitus   . Hyperlipidemia   . Hypokalemia   . Kidney stones   . Ovarian cancer ([place])   . Ovarian cyst     There are no problems to display for this patient.   Past Surgical History:  Procedure Laterality Date  . ABDOMINAL SURGERY    . LITHOTRIPSY     89- kidney stone sx    OB History   No obstetric history on file.      Home Medications    Prior to Admission medications   Medication Sig Start Date End Date Taking? Authorizing Provider  aspirin 81 MG tablet Take 81 mg by mouth daily.    [provider]  buPROPion (WELLBUTRIN XL) 150 MG 24 hr tablet Take 150 mg by mouth daily.    [provider]  citalopram (CELEXA) 20 MG tablet Take 20 mg by mouth daily.    [provider]  cyclobenzaprine (FLEXERIL) 10 MG tablet Take 1 tablet (10 mg total) by mouth at bedtime. For muscle relaxant 06/29/19   Jacqulyn Cane, MD  ezetimibe (ZETIA) 10 MG tablet Take 10 mg by mouth daily.    [provider]  insulin lispro protamine-lispro (HUMALOG 75/25 MIX) (75-25) 100 UNIT/ML SUSP injection Inject into the skin.    [provider]  lisinopril (PRINIVIL,ZESTRIL) 5 MG tablet Take 10 mg by mouth daily.     [provider]  meloxicam (MOBIC) 7.5 MG tablet Take 1 twice a day as needed for pain. Take with food. (Do not take with any other NSAID.) 06/29/19   Jacqulyn Cane, MD  metFORMIN (GLUCOPHAGE) 500  MG tablet Take by mouth 2 (two) times daily with a meal.    [provider]  mupirocin nasal ointment (BACTROBAN) 2 % Apply in each nostril daily 12/01/19   Wardell Honour, MD  naproxen (NAPROSYN) 500 MG tablet Take 1 tablet (500 mg total) by mouth 2 (two) times daily. 06/24/18   Petrucelli, Samantha R, PA-C  potassium citrate (UROCIT-K) 10 MEQ (1080 MG) SR tablet Take 10 mEq by mouth 3 (three) times daily with meals.    [provider]  Semaglutide (OZEMPIC, 1 MG/DOSE, ) Inject 1 Units into the skin once a week.    [provider]  SUMAtriptan (IMITREX) 100 MG tablet Take by mouth. 11/22/17 11/23/18  [provider]    Family History Family History  Problem Relation Age of Onset  . Atrial fibrillation Mother     Social History Social History   Tobacco Use  . Smoking status: Never Smoker  . Smokeless tobacco: Never Used  Substance Use Topics  . Alcohol use: No  . Drug use: No     Allergies   Bactrim [sulfamethoxazole-trimethoprim], Caffeine, and Morphine and related   Review of Systems Review of Systems  Skin: Positive for  wound.  All other systems reviewed and are negative.    Physical Exam Triage Vital Signs ED Triage Vitals  Enc Vitals Group     BP 12/01/19 1344 (!) 152/75     Pulse Rate 12/01/19 1344 70     Resp 12/01/19 1344 18     Temp 12/01/19 1344 98.6 F (37 C)     Temp Source 12/01/19 1344 Oral     SpO2 12/01/19 1344 96 %     Weight --      Height --      Head Circumference --      Peak Flow --      Pain Score 12/01/19 1345 1     Pain Loc --      Pain Edu? --      Excl. in [place]? --    No data found.  Updated Vital Signs BP (!) 152/75 (BP Location: Right Wrist)   Pulse 70   Temp 98.6 F (37 C) (Oral)   Resp 18   LMP 08/03/2016   SpO2 96%   Visual Acuity Right Eye Distance:   Left Eye Distance:   Bilateral Distance:    Right Eye Near:   Left Eye Near:    Bilateral Near:     Physical Exam Vitals and  nursing note reviewed.  Constitutional:      Appearance: Normal appearance. She is obese.  Abdominal:     General: Abdomen is flat.     Palpations: Abdomen is soft.     Comments: Midline scar looks like it is healing well, however there is some redness on the left side of the umbilicus.  There is no drainage but a small amount of coagulum.  Consistent with stitch working its way out.  Also consider Neosporin as cause for the mild redness  Neurological:     Mental Status: She is alert.      UC Treatments / Results  Labs (all labs ordered are listed, but only abnormal results are displayed) Labs Reviewed - No data to display  EKG   Radiology No results found.  Procedures Procedures (including critical care time)  Medications Ordered in UC Medications - No data to display  Initial Impression / Assessment and Plan / UC Course  I have reviewed the triage vital signs and the nursing notes.  Pertinent labs & imaging results that were available during my care of the patient were reviewed by me and considered in my medical decision making (see chart for details).     Surgical wound recheck.  Would recommend warm compresses, discontinuation of Neosporin and initiation of mupirocin Final Clinical Impressions(s) / UC Diagnoses   Final diagnoses:  Visit for wound check     Discharge Instructions     Apply warm compresses 3-4 times per day to affected area   ED Prescriptions    Medication Sig Dispense Auth. Provider   mupirocin nasal ointment (BACTROBAN) 2 % Apply in each nostril daily 1 g Wardell Honour, MD     PDMP not reviewed this encounter.   Wardell Honour, MD 12/01/19 1410

## 2019-12-01 NOTE — Discharge Instructions (Addendum)
Apply warm compresses 3-4 times per day to affected area

## 2019-12-01 NOTE — ED Triage Notes (Signed)
Patient had GYN surgery 11/05/19 and has abdominal incision that appears to have a couple sutures trying to work its way out deeper closure; redness and tenderness/burning. Awaiting another lap surgery on 12/27/19. No OTCs since midnight.

## 2020-12-11 ENCOUNTER — Emergency Department (INDEPENDENT_AMBULATORY_CARE_PROVIDER_SITE_OTHER)
Admission: RE | Admit: 2020-12-11 | Discharge: 2020-12-11 | Disposition: A | Discharge status: Home / Self Care | Payer: PRIVATE HEALTH INSURANCE | Source: Ambulatory Visit | Attending: Family Medicine | Admitting: Family Medicine

## 2020-12-11 ENCOUNTER — Other Ambulatory Visit: Payer: Self-pay

## 2020-12-11 ENCOUNTER — Emergency Department (INDEPENDENT_AMBULATORY_CARE_PROVIDER_SITE_OTHER): Payer: PRIVATE HEALTH INSURANCE

## 2020-12-11 VITALS — BP 155/88 | HR 76 | Temp 98.4°F | Resp 24 | Ht 65.0 in | Wt 214.0 lb

## 2020-12-11 DIAGNOSIS — R0789 Other chest pain: Secondary | ICD-10-CM | POA: Diagnosis not present

## 2020-12-11 DIAGNOSIS — J9801 Acute bronchospasm: Secondary | ICD-10-CM

## 2020-12-11 DIAGNOSIS — R0602 Shortness of breath: Secondary | ICD-10-CM | POA: Diagnosis not present

## 2020-12-11 DIAGNOSIS — M940 Chondrocostal junction syndrome [Tietze]: Secondary | ICD-10-CM

## 2020-12-11 DIAGNOSIS — R079 Chest pain, unspecified: Secondary | ICD-10-CM | POA: Diagnosis not present

## 2020-12-11 MED ORDER — METHYLPREDNISOLONE SODIUM SUCC 125 MG IJ SOLR
80.0000 mg | Freq: Once | INTRAMUSCULAR | Status: AC
  Administered 2020-12-11: 80 mg via INTRAMUSCULAR

## 2020-12-11 MED ORDER — PREDNISONE 20 MG PO TABS: ORAL_TABLET | ORAL | 0 refills | Status: AC

## 2020-12-11 NOTE — ED Triage Notes (Signed)
Pt presents to Urgent Care with c/o sob x one week, worsening today. Developed mid-sternal cp this AM. No nausea or L arm pain. Also reports swelling to R foot--no known injury.

## 2020-12-11 NOTE — Discharge Instructions (Signed)
Begin prednisone Saturday 12/12/20. Apply ice pack to your mid-sternum: Put ice in a plastic bag. Place a towel between your skin and the bag. Leave the ice on for 20 minutes, 2-3 times a day.  If symptoms become significantly worse during the night or over the weekend, proceed to the local emergency room.

## 2020-12-11 NOTE — ED Provider Notes (Signed)
[facility]    CSN: [id number] Arrival date & time: 12/11/20  1806      History   Chief Complaint Chief Complaint  Patient presents with  . Shortness of Breath  . Chest Pain    HPI Catherine Marshall is a 50 y.o. female.   One week ago patient began to feel winded with light activity, with onset of occasional wheezing, worse today.  This morning she developed pain/tightness in her anterior chest.  She denies cough, fever, nausea, and recent URI.  Her chest discomfort doe not radiate.  She has also noticed some mild painless swelling in her right foot, but no pain or swelling in her lower legs.   She has a history of seasonal rhinitis for which she takes Zyrtec, and distant past history of asthma as a child. She has a family history of asthma in two paternal uncles.  The history is provided by the patient.  Shortness of Breath Severity:  Mild Onset quality:  Gradual Duration:  2 weeks Timing:  Constant Progression:  Worsening Chronicity:  New Context: activity   Relieved by:  None tried Worsened by:  Exertion Ineffective treatments:  None tried Associated symptoms: chest pain and wheezing   Associated symptoms: no abdominal pain, no cough, no diaphoresis, no fever, no headaches, no PND, no sore throat, no swollen glands and no vomiting   Risk factors: obesity   Risk factors: no hx of PE/DVT, no prolonged immobilization and no recent surgery   Chest Pain Associated symptoms: shortness of breath   Associated symptoms: no abdominal pain, no cough, no diaphoresis, no fatigue, no fever, no headache, no nausea, no palpitations, no PND and no vomiting     Past Medical History:  Diagnosis Date  . Depression   . Diabetes mellitus   . Hyperlipidemia   . Hypokalemia   . Kidney stones   . Ovarian cancer ([place])   . Ovarian cyst     There are no problems to display for this patient.   Past Surgical History:  Procedure Laterality Date  . ABDOMINAL SURGERY    .  KIDNEY SURGERY Bilateral   . LITHOTRIPSY     89- kidney stone sx    OB History   No obstetric history on file.      Home Medications    Prior to Admission medications   Medication Sig Start Date End Date Taking? Authorizing Provider  methenamine (HIPREX) 1 g tablet Take 1 g by mouth 2 (two) times daily with a meal.   Yes [provider]  predniSONE (DELTASONE) 20 MG tablet Take one tab by mouth twice daily for 4 days, then one daily for 3 days. Take with food. 12/11/20  Yes Kandra Nicolas, MD  aspirin 81 MG tablet Take 81 mg by mouth daily.    [provider]  buPROPion (WELLBUTRIN XL) 150 MG 24 hr tablet Take 150 mg by mouth daily.    [provider]  citalopram (CELEXA) 20 MG tablet Take 20 mg by mouth daily.    [provider]  cyclobenzaprine (FLEXERIL) 10 MG tablet Take 1 tablet (10 mg total) by mouth at bedtime. For muscle relaxant 06/29/19   Jacqulyn Cane, MD  ezetimibe (ZETIA) 10 MG tablet Take 10 mg by mouth daily.    [provider]  insulin lispro protamine-lispro (HUMALOG 75/25 MIX) (75-25) 100 UNIT/ML SUSP injection Inject into the skin.    [provider]  lisinopril (PRINIVIL,ZESTRIL) 5 MG tablet Take 10  mg by mouth daily.     [provider]  meloxicam (MOBIC) 7.5 MG tablet Take 1 twice a day as needed for pain. Take with food. (Do not take with any other NSAID.) 06/29/19   Jacqulyn Cane, MD  metFORMIN (GLUCOPHAGE) 500 MG tablet Take by mouth 2 (two) times daily with a meal.    [provider]  mupirocin nasal ointment (BACTROBAN) 2 % Apply in each nostril daily 12/01/19   Wardell Honour, MD  naproxen (NAPROSYN) 500 MG tablet Take 1 tablet (500 mg total) by mouth 2 (two) times daily. 06/24/18   Petrucelli, Samantha R, PA-C  potassium citrate (UROCIT-K) 10 MEQ (1080 MG) SR tablet Take 10 mEq by mouth 3 (three) times daily with meals.    [provider]  Semaglutide (OZEMPIC, 1 MG/DOSE, [place])  Inject 1 Units into the skin once a week.    [provider]  SUMAtriptan (IMITREX) 100 MG tablet Take by mouth. 11/22/17 11/23/18  [provider]    Family History Family History  Problem Relation Age of Onset  . Atrial fibrillation Mother   . Stroke Father   . Heart Problems Father     Social History Social History   Tobacco Use  . Smoking status: Never Smoker  . Smokeless tobacco: Never Used  Vaping Use  . Vaping Use: Never used  Substance Use Topics  . Alcohol use: No  . Drug use: No     Allergies   Bactrim [sulfamethoxazole-trimethoprim], Caffeine, and Morphine and related   Review of Systems Review of Systems  Constitutional: Negative for activity change, appetite change, chills, diaphoresis, fatigue and fever.  HENT: Negative.  Negative for sore throat.   Eyes: Negative.   Respiratory: Positive for chest tightness, shortness of breath and wheezing. Negative for cough and stridor.   Cardiovascular: Positive for chest pain. Negative for palpitations, leg swelling and PND.  Gastrointestinal: Negative for abdominal pain, diarrhea, nausea and vomiting.  Genitourinary: Negative.   Musculoskeletal: Negative.   Skin: Negative.   Neurological: Negative for headaches.  Hematological: Negative for adenopathy.     Physical Exam Triage Vital Signs ED Triage Vitals  Enc Vitals Group     BP 12/11/20 1829 (!) 169/84     Pulse Rate 12/11/20 1829 76     Resp 12/11/20 1829 (!) 24     Temp 12/11/20 1829 98.4 F (36.9 C)     Temp Source 12/11/20 1829 Oral     SpO2 12/11/20 1829 99 %     Weight 12/11/20 1824 214 lb (97.1 kg)     Height 12/11/20 1824 5\' 5"  (1.651 m)     Head Circumference --      Peak Flow --      Pain Score 12/11/20 1823 5     Pain Loc --      Pain Edu? --      Excl. in [place]? --    No data found.  Updated Vital Signs BP (!) 155/88   Pulse 76   Temp 98.4 F (36.9 C) (Oral)   Resp (!) 24   Ht 5\' 5"  (1.651 m)   Wt 97.1 kg   LMP  08/03/2016   SpO2 99%   BMI 35.61 kg/m   Visual Acuity Right Eye Distance:   Left Eye Distance:   Bilateral Distance:    Right Eye Near:   Left Eye Near:    Bilateral Near:     Physical Exam Vitals and nursing note reviewed.  Constitutional:      General: She is not in acute distress.    Appearance: She is obese.  HENT:     Head: Normocephalic.     Right Ear: Tympanic membrane normal.     Left Ear: Tympanic membrane normal.     Nose: Nose normal.     Mouth/Throat:     Pharynx: Oropharynx is clear.  Eyes:     Conjunctiva/sclera: Conjunctivae normal.     Pupils: Pupils are equal, round, and reactive to light.  Cardiovascular:     Rate and Rhythm: Normal rate and regular rhythm.     Heart sounds: Normal heart sounds.  Pulmonary:     Breath sounds: Normal breath sounds.  Chest:     Chest wall: Tenderness present.       Comments: Chest:  Distinct tenderness to palpation over the mid and lower sternum. Palpation there recreates her pain. Abdominal:     Palpations: Abdomen is soft.     Tenderness: There is no abdominal tenderness.  Musculoskeletal:        General: No tenderness.     Cervical back: Neck supple.     Right lower leg: No edema.     Left lower leg: No edema.     Comments: Dorsum of right foot has mild swelling but there is no erythema, tenderness, or warmth.  No tenderness to palpation right posterior calf.  Lymphadenopathy:     Cervical: No cervical adenopathy.  Skin:    General: Skin is warm and dry.     Findings: No rash.  Neurological:     Mental Status: She is alert and oriented to person, place, and time.      UC Treatments / Results  Labs (all labs ordered are listed, but only abnormal results are displayed) Labs Reviewed - No data to display  EKG  Rate:  71 BPM PR:  144 msec QT:  424 msec QTcH:  460 msec QRSD:  96 msec QRS axis:  50 degrees Interpretation:  Within normal limits; normal sinus rhythm    Radiology DG Chest 2  View  Result Date: 12/11/2020 CLINICAL DATA:  Shortness of breath and anterior chest tightness x1 week EXAM: CHEST - 2 VIEW COMPARISON:  Chest radiograph April 18, 2020 and chest CT April 09, 2019 FINDINGS: The heart size and mediastinal contours are within normal limits. No focal consolidation. No pleural effusion. No pneumothorax. The visualized skeletal structures are unremarkable. IMPRESSION: No acute cardiopulmonary disease. Electronically Signed   By: Dahlia Bailiff MD   On: 12/11/2020 19:05    Procedures Procedures (including critical care time)  Medications Ordered in UC Medications  methylPREDNISolone sodium succinate (SOLU-MEDROL) 125 mg/2 mL injection 80 mg (has no administration in time range)    Initial Impression / Assessment and Plan / UC Course  I have reviewed the triage vital signs and the nursing notes.  Pertinent labs & imaging results that were available during my care of the patient were reviewed by me and considered in my medical decision making (see chart for details).    Normal EKG and chest X-ray reassuring.  Review of past records reveals a similar EKG done 06/30/16. Administered Solumedrol 80mg  IM.  Then begin prednisone burst/taper. Followup with Family Doctor if not improved in one week.    Final Clinical Impressions(s) / UC Diagnoses   Final diagnoses:  Chest pain, unspecified type  Costochondritis  Acute bronchospasm     Discharge Instructions     Begin prednisone Saturday  12/12/20. Apply ice pack to your mid-sternum: ? Put ice in a plastic bag. ? Place a towel between your skin and the bag. ? Leave the ice on for 20 minutes, 2-3 times a day.  If symptoms become significantly worse during the night or over the weekend, proceed to the local emergency room.     ED Prescriptions    Medication Sig Dispense Auth. Provider   predniSONE (DELTASONE) 20 MG tablet Take one tab by mouth twice daily for 4 days, then one daily for 3 days. Take with food.  11 tablet Kandra Nicolas, MD        Kandra Nicolas, MD 12/13/20 [phone]

## 2021-02-14 ENCOUNTER — Emergency Department (HOSPITAL_BASED_OUTPATIENT_CLINIC_OR_DEPARTMENT_OTHER): Payer: 59

## 2021-02-14 ENCOUNTER — Other Ambulatory Visit: Payer: Self-pay

## 2021-02-14 ENCOUNTER — Emergency Department (HOSPITAL_BASED_OUTPATIENT_CLINIC_OR_DEPARTMENT_OTHER)
Admission: EM | Admit: 2021-02-14 | Discharge: 2021-02-14 | Disposition: A | Discharge status: Home / Self Care | Payer: 59 | Attending: Emergency Medicine | Admitting: Emergency Medicine

## 2021-02-14 DIAGNOSIS — I10 Essential (primary) hypertension: Secondary | ICD-10-CM | POA: Insufficient documentation

## 2021-02-14 DIAGNOSIS — Z8543 Personal history of malignant neoplasm of ovary: Secondary | ICD-10-CM | POA: Diagnosis not present

## 2021-02-14 DIAGNOSIS — Z79899 Other long term (current) drug therapy: Secondary | ICD-10-CM | POA: Insufficient documentation

## 2021-02-14 DIAGNOSIS — R0602 Shortness of breath: Secondary | ICD-10-CM | POA: Insufficient documentation

## 2021-02-14 DIAGNOSIS — Z7984 Long term (current) use of oral hypoglycemic drugs: Secondary | ICD-10-CM | POA: Diagnosis not present

## 2021-02-14 DIAGNOSIS — E119 Type 2 diabetes mellitus without complications: Secondary | ICD-10-CM | POA: Insufficient documentation

## 2021-02-14 DIAGNOSIS — Z7982 Long term (current) use of aspirin: Secondary | ICD-10-CM | POA: Insufficient documentation

## 2021-02-14 DIAGNOSIS — Z794 Long term (current) use of insulin: Secondary | ICD-10-CM | POA: Insufficient documentation

## 2021-02-14 DIAGNOSIS — Z20822 Contact with and (suspected) exposure to covid-19: Secondary | ICD-10-CM | POA: Insufficient documentation

## 2021-02-14 LAB — CBC WITH DIFFERENTIAL/PLATELET
Abs Immature Granulocytes: 0.03 10*3/uL (ref 0.00–0.07)
Basophils Absolute: 0.1 10*3/uL (ref 0.0–0.1)
Basophils Relative: 1 %
Eosinophils Absolute: 0.4 10*3/uL (ref 0.0–0.5)
Eosinophils Relative: 4 %
HCT: 44.6 % (ref 36.0–46.0)
Hemoglobin: 15.6 g/dL — ABNORMAL HIGH (ref 12.0–15.0)
Immature Granulocytes: 0 %
Lymphocytes Relative: 35 %
Lymphs Abs: 3.2 10*3/uL (ref 0.7–4.0)
MCH: 30.1 pg (ref 26.0–34.0)
MCHC: 35 g/dL (ref 30.0–36.0)
MCV: 86.1 fL (ref 80.0–100.0)
Monocytes Absolute: 0.5 10*3/uL (ref 0.1–1.0)
Monocytes Relative: 6 %
Neutro Abs: 5.1 10*3/uL (ref 1.7–7.7)
Neutrophils Relative %: 54 %
Platelets: 251 10*3/uL (ref 150–400)
RBC: 5.18 MIL/uL — ABNORMAL HIGH (ref 3.87–5.11)
RDW: 12.6 % (ref 11.5–15.5)
WBC: 9.3 10*3/uL (ref 4.0–10.5)
nRBC: 0 % (ref 0.0–0.2)

## 2021-02-14 LAB — BASIC METABOLIC PANEL
Anion gap: 8 (ref 5–15)
BUN: 17 mg/dL (ref 6–20)
CO2: 25 mmol/L (ref 22–32)
Calcium: 8.8 mg/dL — ABNORMAL LOW (ref 8.9–10.3)
Chloride: 104 mmol/L (ref 98–111)
Creatinine, Ser: 0.85 mg/dL (ref 0.44–1.00)
GFR, Estimated: >60 mL/min (ref >60)
Glucose, Bld: 97 mg/dL (ref 70–99)
Potassium: 3.3 mmol/L — ABNORMAL LOW (ref 3.5–5.1)
Sodium: 137 mmol/L (ref 135–145)

## 2021-02-14 LAB — URINALYSIS, MICROSCOPIC (REFLEX)

## 2021-02-14 LAB — URINALYSIS, ROUTINE W REFLEX MICROSCOPIC
Bilirubin Urine: NEGATIVE
Glucose, UA: NEGATIVE mg/dL
Ketones, ur: NEGATIVE mg/dL
Nitrite: NEGATIVE
Protein, ur: NEGATIVE mg/dL
Specific Gravity, Urine: 1.01 (ref 1.005–1.030)
pH: 6 (ref 5.0–8.0)

## 2021-02-14 LAB — RESP PANEL BY RT-PCR (FLU A&B, COVID) ARPGX2
Influenza A by PCR: NEGATIVE
Influenza B by PCR: NEGATIVE
SARS Coronavirus 2 by RT PCR: NEGATIVE

## 2021-02-14 LAB — TROPONIN I (HIGH SENSITIVITY): Troponin I (High Sensitivity): <2 ng/L (ref <18)

## 2021-02-14 NOTE — ED Provider Notes (Signed)
[facility] EMERGENCY DEPARTMENT Provider Note   CSN: [id number] Arrival date & time: 02/14/21  1619     History Chief Complaint  Patient presents with  . Shortness of Breath    Catherine Marshall is a 50 y.o. female with PMHx HTN, HLD, Diabetes, and hx of ovarian cancer s/p hysterectomy and chemotherapy last year who presents to the ED today with complaint of intermittent shortness of breath that began last night. Pt reports that she has a hx of sleep apnea and has been wearing a CPAP for years. Last night she went to bed with her CPAP machine and about 45 minutes later woke up feeling like she was "suffocating." She reports she removed the CPAP machine and the feeling went away after a couple of hours; she was able to go back to sleep successfully with her CPAP around 3 AM. When she woke up this morning she noticed an overall "tightness" in her chest but did not think much of it. While at work today she began feeling a similar feeling of "suffocating" however this time she was sitting upright. That feeling lasted a couple of minutes before dissipating. She called her oncologist who recommended she come to the ED for further evaluation. Pt last received chemo in June of last year. She denies any recent prolonged travel or immobilization. No hx DVT/PE. No active malignancy that pt is aware of. She wears an estrogen patch however denies OCP use. No hemoptysis. No other complaints at this time.   Per chart review pt was seen by PCP last week for multiple issues including sinus pressure; she is currently on Augmentin for sinus infection. She states she feels like those symptoms are improving. She denies any recent sick contact and is fully vaccinated.   The history is provided by the patient and medical records.       Past Medical History:  Diagnosis Date  . Depression   . Diabetes mellitus   . Hyperlipidemia   . Hypokalemia   . Kidney stones   . Ovarian cancer ([place])   . Ovarian cyst      There are no problems to display for this patient.   Past Surgical History:  Procedure Laterality Date  . ABDOMINAL SURGERY    . KIDNEY SURGERY Bilateral   . LITHOTRIPSY     89- kidney stone sx     OB History   No obstetric history on file.     Family History  Problem Relation Age of Onset  . Atrial fibrillation Mother   . Stroke Father   . Heart Problems Father     Social History   Tobacco Use  . Smoking status: Never Smoker  . Smokeless tobacco: Never Used  Vaping Use  . Vaping Use: Never used  Substance Use Topics  . Alcohol use: No  . Drug use: No    Home Medications Prior to Admission medications   Medication Sig Start Date End Date Taking? Authorizing Provider  aspirin 81 MG tablet Take 81 mg by mouth daily.    [provider]  buPROPion (WELLBUTRIN XL) 150 MG 24 hr tablet Take 150 mg by mouth daily.    [provider]  citalopram (CELEXA) 20 MG tablet Take 20 mg by mouth daily.    [provider]  cyclobenzaprine (FLEXERIL) 10 MG tablet Take 1 tablet (10 mg total) by mouth at bedtime. For muscle relaxant 06/29/19   Jacqulyn Cane, MD  ezetimibe (ZETIA) 10 MG tablet Take 10 mg  by mouth daily.    [provider]  insulin lispro protamine-lispro (HUMALOG 75/25 MIX) (75-25) 100 UNIT/ML SUSP injection Inject into the skin.    [provider]  lisinopril (PRINIVIL,ZESTRIL) 5 MG tablet Take 10 mg by mouth daily.     [provider]  meloxicam (MOBIC) 7.5 MG tablet Take 1 twice a day as needed for pain. Take with food. (Do not take with any other NSAID.) 06/29/19   Jacqulyn Cane, MD  metFORMIN (GLUCOPHAGE) 500 MG tablet Take by mouth 2 (two) times daily with a meal.    [provider]  methenamine (HIPREX) 1 g tablet Take 1 g by mouth 2 (two) times daily with a meal.    [provider]  mupirocin nasal ointment (BACTROBAN) 2 % Apply in each nostril daily 12/01/19   Wardell Honour, MD   naproxen (NAPROSYN) 500 MG tablet Take 1 tablet (500 mg total) by mouth 2 (two) times daily. 06/24/18   Petrucelli, Samantha R, PA-C  potassium citrate (UROCIT-K) 10 MEQ (1080 MG) SR tablet Take 10 mEq by mouth 3 (three) times daily with meals.    [provider]  predniSONE (DELTASONE) 20 MG tablet Take one tab by mouth twice daily for 4 days, then one daily for 3 days. Take with food. 12/11/20   Kandra Nicolas, MD  Semaglutide (OZEMPIC, 1 MG/DOSE, [place]) Inject 1 Units into the skin once a week.    [provider]  SUMAtriptan (IMITREX) 100 MG tablet Take by mouth. 11/22/17 11/23/18  [provider]    Allergies    Bactrim [sulfamethoxazole-trimethoprim], Caffeine, and Morphine and related  Review of Systems   Review of Systems  Constitutional: Negative for chills and fever.  HENT: Positive for sinus pressure (improving).   Respiratory: Positive for chest tightness and shortness of breath. Negative for cough and wheezing.   Cardiovascular: Negative for chest pain.  Gastrointestinal: Negative for abdominal pain, diarrhea, nausea and vomiting.  Musculoskeletal: Negative for myalgias.  All other systems reviewed and are negative.   Physical Exam Updated Vital Signs BP 134/88 (BP Location: Right Arm)   Pulse 68   Temp 98.5 F (36.9 C) (Oral)   Ht 5\' 5"  (1.651 m)   Wt 99.8 kg   LMP 08/03/2016   SpO2 100%   BMI 36.61 kg/m   Physical Exam Vitals and nursing note reviewed.  Constitutional:      Appearance: She is obese. She is not ill-appearing or diaphoretic.  HENT:     Head: Normocephalic and atraumatic.     Comments: TMs clear bilaterally    Nose: Congestion present.  Eyes:     Conjunctiva/sclera: Conjunctivae normal.  Cardiovascular:     Rate and Rhythm: Normal rate and regular rhythm.     Pulses: Normal pulses.  Pulmonary:     Effort: Pulmonary effort is normal.     Breath sounds: Normal breath sounds. No decreased breath sounds, wheezing, rhonchi  or rales.     Comments: Speaking in full sentences without difficulty. Satting 100% on RA. LCTAB.  Chest:     Chest wall: No tenderness.  Abdominal:     Palpations: Abdomen is soft.     Tenderness: There is no abdominal tenderness.  Musculoskeletal:     Cervical back: Neck supple.     Right lower leg: No tenderness. No edema.     Left lower leg: No tenderness. No edema.  Skin:    General: Skin is warm and dry.  Neurological:  Mental Status: She is alert.     ED Results / Procedures / Treatments   Labs (all labs ordered are listed, but only abnormal results are displayed) Labs Reviewed  BASIC METABOLIC PANEL - Abnormal; Notable for the following components:      Result Value   Potassium 3.3 (*)    Calcium 8.8 (*)    All other components within normal limits  CBC WITH DIFFERENTIAL/PLATELET - Abnormal; Notable for the following components:   RBC 5.18 (*)    Hemoglobin 15.6 (*)    All other components within normal limits  URINALYSIS, ROUTINE W REFLEX MICROSCOPIC - Abnormal; Notable for the following components:   Color, Urine STRAW (*)    Hgb urine dipstick MODERATE (*)    Leukocytes,Ua SMALL (*)    All other components within normal limits  URINALYSIS, MICROSCOPIC (REFLEX) - Abnormal; Notable for the following components:   Bacteria, UA FEW (*)    All other components within normal limits  RESP PANEL BY RT-PCR (FLU A&B, COVID) ARPGX2  TROPONIN I (HIGH SENSITIVITY)    EKG EKG Interpretation  Date/Time:  Sunday Feb 14 2021 17:19:58 EDT Ventricular Rate:  59 PR Interval:  139 QRS Duration: 108 QT Interval:  445 QTC Calculation: 441 R Axis:   74 Text Interpretation: Sinus rhythm Confirmed by Curatolo, Adam ([id number]) on 02/14/2021 5:22:45 PM   Radiology DG Chest Port 1 View  Result Date: 02/14/2021 CLINICAL DATA:  Shortness of breath EXAM: PORTABLE CHEST 1 VIEW COMPARISON:  December 11, 2020 FINDINGS: The cardiomediastinal silhouette is unchanged in contour. No pleural  effusion. No pneumothorax. No acute pleuroparenchymal abnormality. Visualized abdomen is unremarkable. No acute osseous abnormality. IMPRESSION: No acute cardiopulmonary abnormality. Electronically Signed   By: Stephanie  Peacock MD   On: 02/14/2021 17:33    Procedures Procedures   Medications Ordered in ED Medications - No data to display  ED Course  I have reviewed the triage vital signs and the nursing notes.  Pertinent labs & imaging results that were available during my care of the patient were reviewed by me and considered in my medical decision making (see chart for details).    MDM Rules/Calculators/A&P                          49  year old female who presents to the ED today with complaint of intermittent feelings of "suffocating" once while sleeping with her CPAP machine last night and once while at work today.  Also having chest tightness.  Currently being treated with Augmentin for sinus infection.  Fully vaccinated against COVID.  On arrival to the ED today vitals are stable.  Patient is afebrile, nontachycardic and nontachypneic and appears to be in no acute distress.  She is lying comfortably back in the stretcher, speaking in full sentences without accessory muscle use.  Lungs are clear to auscultation bilaterally.  She does not appear fluid overloaded at this time.  She does have a history of ovarian cancer last year, treated with hysterectomy and chemotherapy, completed in June of last year.  She denies any risk factors for PE or DVT and given overall well appearance with stable vital signs I have lower suspicion for this today.  We will plan to work-up for shortness of breath with chest x-ray, EKG, labs including troponin.  We will also swab for COVID at this time given sinus pressure and new complaints of shortness of breath.  EKG without acute ischemic changes CBC without leukocytosis.  Hgb stable at 15.6.  BMP with potassium 3.3. No other electrolyte abnormalities.   Troponin < 2 CXR clear U/A with moderate hgb, small leuks, 11-20 RBCS, and few bacteria. Pt reports hx of IC as well as multiple kidney stones/surgery. She states she always has blood in her urine and is not having any urinary symptoms at this time.   Workup reassuring at this time. Pt continues to rest comfortably without feelings of suffocating at this time.   COVID test is pending and will return shortly. At shift change case signed out to Benedetto Goad, PA-C who will dispo patient accordingly. If COVID negative have advised she follow up with her PCP for further evaluation. If positive pt may benefit from oral antivirals given significant comorbidity.   This note was prepared using Dragon voice recognition software and may include unintentional dictation errors due to the inherent limitations of voice recognition software.  Final Clinical Impression(s) / ED Diagnoses Final diagnoses:  SOB (shortness of breath)    Rx / DC Orders ED Discharge Orders    None       Discharge Instructions     Your workup was overall reassuring today without any abnormalities to account for your shortness of breath. Continue wearing your CPAP at nighttime.  Please follow up with your PCP next week for further evaluation.   Return to the ED IMMEDIATELY for any worsening symptoms        Eustaquio Maize, PA-C 02/14/21 1949    Lennice Sites, DO 02/14/21 2100

## 2021-02-14 NOTE — ED Triage Notes (Signed)
"  Smothering" feeling at all times despite position or activity.  Hx OSA  Wears CPAP; awoke with onset of sx last night Gait steady NAD

## 2021-02-14 NOTE — ED Provider Notes (Signed)
Care assumed from [place] at shift change pending COVID test, please see her note for full details, Catherine Marshall is a 50 y.o. female who presented with episodes of shortness of breath and feeling like she was suffocating.  Work-up overall has been very reassuring.   Plan: Discharge home as long as COVID test is negative.  Patient has ambulated and maintained normal O2 sats with no further episodes of shortness of breath or feeling like she is suffocating here in the ED.  Labs Reviewed  BASIC METABOLIC PANEL - Abnormal; Notable for the following components:      Result Value   Potassium 3.3 (*)    Calcium 8.8 (*)    All other components within normal limits  CBC WITH DIFFERENTIAL/PLATELET - Abnormal; Notable for the following components:   RBC 5.18 (*)    Hemoglobin 15.6 (*)    All other components within normal limits  URINALYSIS, ROUTINE W REFLEX MICROSCOPIC - Abnormal; Notable for the following components:   Color, Urine STRAW (*)    Hgb urine dipstick MODERATE (*)    Leukocytes,Ua SMALL (*)    All other components within normal limits  URINALYSIS, MICROSCOPIC (REFLEX) - Abnormal; Notable for the following components:   Bacteria, UA FEW (*)    All other components within normal limits  RESP PANEL BY RT-PCR (FLU A&B, COVID) ARPGX2  TROPONIN I (HIGH SENSITIVITY)   COVID test is negative, will discharge patient home for outpatient PCP follow-up and instructions per previous provider as planned.   Jacqlyn Larsen, PA-C 02/14/21 2013    Lennice Sites, DO 02/14/21 2100

## 2021-02-14 NOTE — ED Notes (Signed)
Discharge instructions discussed with pt. Pt verbalized understanding. Pt stable and ambulatory.  °

## 2021-02-14 NOTE — Discharge Instructions (Addendum)
Your workup was overall reassuring today without any abnormalities to account for your shortness of breath. Continue wearing your CPAP at nighttime.  Please follow up with your PCP next week for further evaluation.   Return to the ED IMMEDIATELY for any worsening symptoms

## 2021-02-14 NOTE — ED Notes (Signed)
ED Provider at bedside. 

## 2021-06-27 ENCOUNTER — Encounter: Payer: Self-pay | Admitting: Emergency Medicine

## 2021-06-27 ENCOUNTER — Emergency Department (INDEPENDENT_AMBULATORY_CARE_PROVIDER_SITE_OTHER)
Admission: EM | Admit: 2021-06-27 | Discharge: 2021-06-27 | Disposition: A | Discharge status: Home / Self Care | Payer: PRIVATE HEALTH INSURANCE | Source: Home / Self Care | Attending: Family Medicine | Admitting: Family Medicine

## 2021-06-27 ENCOUNTER — Emergency Department (INDEPENDENT_AMBULATORY_CARE_PROVIDER_SITE_OTHER): Payer: PRIVATE HEALTH INSURANCE

## 2021-06-27 ENCOUNTER — Other Ambulatory Visit: Payer: Self-pay

## 2021-06-27 DIAGNOSIS — H9201 Otalgia, right ear: Secondary | ICD-10-CM

## 2021-06-27 DIAGNOSIS — R059 Cough, unspecified: Secondary | ICD-10-CM | POA: Diagnosis not present

## 2021-06-27 DIAGNOSIS — R051 Acute cough: Secondary | ICD-10-CM

## 2021-06-27 DIAGNOSIS — J069 Acute upper respiratory infection, unspecified: Secondary | ICD-10-CM

## 2021-06-27 MED ORDER — AZITHROMYCIN 250 MG PO TABS
ORAL_TABLET | ORAL | 0 refills | Status: AC
Start: 2021-06-27 — End: ?

## 2021-06-27 MED ORDER — BENZONATATE 200 MG PO CAPS: 200.0000 mg | ORAL_CAPSULE | Freq: Two times a day (BID) | ORAL | 0 refills | Status: AC | PRN

## 2021-06-27 MED ORDER — FLUTICASONE PROPIONATE 50 MCG/ACT NA SUSP: 2.0000 | Freq: Every day | NASAL | 0 refills | Status: AC

## 2021-06-27 NOTE — ED Triage Notes (Signed)
Patient presents to Urgent Care with complaints of cough-productive since 3 days ago. Patient reports sneezing, right ear popping, cough-productive. Denies fever or chills. Taking otc allergy medication with no relief.

## 2021-06-27 NOTE — ED Provider Notes (Signed)
[name]    CSN: [id number] Arrival date & time: 06/27/21  1359      History   Chief Complaint Chief Complaint  Patient presents with   Otalgia   Cough    HPI Catherine Marshall is a 50 y.o. female.   HPI  She is sick with a productive cough, runny nose, bloody mucus from her sinuses, fatigue, and some fever.  Her COVID test was negative.  She is worried about coughing since she has a ovarian cancer survivor.  She was told that the cancer came back and could come to her lungs.  This worries her.  Past Medical History:  Diagnosis Date   Depression    Diabetes mellitus    Hyperlipidemia    Hypokalemia    Kidney stones    Ovarian cancer ([place])    Ovarian cyst     There are no problems to display for this patient.   Past Surgical History:  Procedure Laterality Date   ABDOMINAL SURGERY     KIDNEY SURGERY Bilateral    LITHOTRIPSY     6- kidney stone sx    OB History   No obstetric history on file.      Home Medications    Prior to Admission medications   Medication Sig Start Date End Date Taking? Authorizing Provider  aspirin 81 MG tablet Take 81 mg by mouth daily.   Yes [provider]  azithromycin (ZITHROMAX Z-PAK) 250 MG tablet Take two pills today followed by one a day until gone 06/27/21  Yes Raylene Everts, MD  benzonatate (TESSALON) 200 MG capsule Take 1 capsule (200 mg total) by mouth 2 (two) times daily as needed for cough. 06/27/21  Yes Raylene Everts, MD  buPROPion (WELLBUTRIN XL) 150 MG 24 hr tablet Take 150 mg by mouth daily.   Yes [provider]  citalopram (CELEXA) 20 MG tablet Take 20 mg by mouth daily.   Yes [provider]  cyclobenzaprine (FLEXERIL) 10 MG tablet Take 1 tablet (10 mg total) by mouth at bedtime. For muscle relaxant 06/29/19  Yes Jacqulyn Cane, MD  ezetimibe (ZETIA) 10 MG tablet Take 10 mg by mouth daily.   Yes [provider]  fluticasone (FLONASE) 50 MCG/ACT nasal spray  Place 2 sprays into both nostrils daily. 06/27/21  Yes Raylene Everts, MD  insulin lispro protamine-lispro (HUMALOG 75/25 MIX) (75-25) 100 UNIT/ML SUSP injection Inject into the skin.   Yes [provider]  lisinopril (PRINIVIL,ZESTRIL) 5 MG tablet Take 10 mg by mouth daily.    Yes [provider]  meloxicam (MOBIC) 7.5 MG tablet Take 1 twice a day as needed for pain. Take with food. (Do not take with any other NSAID.) 06/29/19  Yes Jacqulyn Cane, MD  metFORMIN (GLUCOPHAGE) 500 MG tablet Take by mouth 2 (two) times daily with a meal.   Yes [provider]  methenamine (HIPREX) 1 g tablet Take 1 g by mouth 2 (two) times daily with a meal.   Yes [provider]  mupirocin nasal ointment (BACTROBAN) 2 % Apply in each nostril daily 12/01/19  Yes Wardell Honour, MD  naproxen (NAPROSYN) 500 MG tablet Take 1 tablet (500 mg total) by mouth 2 (two) times daily. 06/24/18  Yes Petrucelli, Samantha R, PA-C  potassium citrate (UROCIT-K) 10 MEQ (1080 MG) SR tablet Take 10 mEq by mouth 3 (three) times daily with meals.   Yes [provider]  predniSONE (DELTASONE) 20 MG tablet Take  one tab by mouth twice daily for 4 days, then one daily for 3 days. Take with food. 12/11/20  Yes Beese, Ishmael Holter, MD  Semaglutide (OZEMPIC, 1 MG/DOSE, [place]) Inject 1 Units into the skin once a week.   Yes [provider]  SUMAtriptan (IMITREX) 100 MG tablet Take by mouth. 11/22/17 11/23/18  [provider]    Family History Family History  Problem Relation Age of Onset   Atrial fibrillation Mother    Stroke Father    Heart Problems Father     Social History Social History   Tobacco Use   Smoking status: Never   Smokeless tobacco: Never  Vaping Use   Vaping Use: Never used  Substance Use Topics   Alcohol use: No   Drug use: No     Allergies   Bactrim [sulfamethoxazole-trimethoprim], Caffeine, and Morphine and related   Review of Systems Review of  Systems See HPI  Physical Exam Triage Vital Signs ED Triage Vitals  Enc Vitals Group     BP 06/27/21 1448 (!) 143/80     Pulse Rate 06/27/21 1448 76     Resp 06/27/21 1448 16     Temp 06/27/21 1448 98.5 F (36.9 C)     Temp Source 06/27/21 1448 Oral     SpO2 06/27/21 1448 96 %     Weight --      Height --      Head Circumference --      Peak Flow --      Pain Score 06/27/21 1446 6     Pain Loc --      Pain Edu? --      Excl. in [place]? --    No data found.  Updated Vital Signs BP (!) 143/80 (BP Location: Left Arm)   Pulse 76   Temp 98.5 F (36.9 C) (Oral)   Resp 16   LMP 08/03/2016   SpO2 96%      Physical Exam Constitutional:      General: She is not in acute distress.    Appearance: Normal appearance. She is well-developed.  HENT:     Head: Normocephalic and atraumatic.     Right Ear: Ear canal and external ear normal.     Left Ear: Ear canal and external ear normal.     Ears:     Comments: Both TMs have fluid    Nose: Congestion and rhinorrhea present.     Mouth/Throat:     Pharynx: Posterior oropharyngeal erythema present.  Eyes:     Conjunctiva/sclera: Conjunctivae normal.     Pupils: Pupils are equal, round, and reactive to light.  Cardiovascular:     Rate and Rhythm: Normal rate and regular rhythm.     Heart sounds: Normal heart sounds.  Pulmonary:     Effort: Pulmonary effort is normal. No respiratory distress.     Breath sounds: Normal breath sounds. No wheezing or rales.  Abdominal:     General: There is no distension.     Palpations: Abdomen is soft.  Musculoskeletal:        General: Normal range of motion.     Cervical back: Normal range of motion.  Lymphadenopathy:     Cervical: Cervical adenopathy present.  Skin:    General: Skin is warm and dry.  Neurological:     Mental Status: She is alert.     UC Treatments / Results  Labs (all labs ordered are listed, but only abnormal results are displayed) Labs  Reviewed - No data to  display  EKG   Radiology DG Chest 2 View  Result Date: 06/27/2021 CLINICAL DATA:  Cough, history of ovarian cancer EXAM: CHEST - 2 VIEW COMPARISON:  02/14/2021 FINDINGS: The heart size and mediastinal contours are within normal limits. Both lungs are clear. The visualized skeletal structures are unremarkable. IMPRESSION: No active cardiopulmonary disease. Electronically Signed   By: Randa Ngo M.D.   On: 06/27/2021 15:35    Procedures Procedures (including critical care time)  Medications Ordered in UC Medications - No data to display  Initial Impression / Assessment and Plan / UC Course  I have reviewed the triage vital signs and the nursing notes.  Pertinent labs & imaging results that were available during my care of the patient were reviewed by me and considered in my medical decision making (see chart for details).     Chest x-ray is done for patient reassurance to rule out malignancy.  It is negative. Patient is to try conservative care.  If she fails after a week, she knows to fill and take her antibiotic Final Clinical Impressions(s) / UC Diagnoses   Final diagnoses:  Viral upper respiratory tract infection  Acute cough  Right ear pain     Discharge Instructions      Drink lots of fluids Use the Flonase twice a day for 3 days then once a day until your symptoms improve Take Tessalon as needed for cough Fill and take the antibiotic if you fail to improve in the next few days   ED Prescriptions     Medication Sig Dispense Auth. Provider   fluticasone (FLONASE) 50 MCG/ACT nasal spray Place 2 sprays into both nostrils daily. 16 g Raylene Everts, MD   benzonatate (TESSALON) 200 MG capsule Take 1 capsule (200 mg total) by mouth 2 (two) times daily as needed for cough. 20 capsule Raylene Everts, MD   azithromycin (ZITHROMAX Z-PAK) 250 MG tablet Take two pills today followed by one a day until gone 6 tablet Meda Coffee Jennette Banker, MD      PDMP not reviewed  this encounter.   Raylene Everts, MD 06/27/21 [phone]

## 2021-06-27 NOTE — Discharge Instructions (Signed)
Drink lots of fluids Use the Flonase twice a day for 3 days then once a day until your symptoms improve Take Tessalon as needed for cough Fill and take the antibiotic if you fail to improve in the next few days

## 2022-01-03 ENCOUNTER — Emergency Department (HOSPITAL_BASED_OUTPATIENT_CLINIC_OR_DEPARTMENT_OTHER): Payer: Managed Care, Other (non HMO)

## 2022-01-03 ENCOUNTER — Other Ambulatory Visit: Payer: Self-pay

## 2022-01-03 ENCOUNTER — Emergency Department (HOSPITAL_BASED_OUTPATIENT_CLINIC_OR_DEPARTMENT_OTHER)
Admission: EM | Admit: 2022-01-03 | Discharge: 2022-01-03 | Disposition: A | Discharge status: Home / Self Care | Payer: Managed Care, Other (non HMO) | Attending: Emergency Medicine | Admitting: Emergency Medicine

## 2022-01-03 ENCOUNTER — Encounter (HOSPITAL_BASED_OUTPATIENT_CLINIC_OR_DEPARTMENT_OTHER): Payer: Self-pay

## 2022-01-03 DIAGNOSIS — Z794 Long term (current) use of insulin: Secondary | ICD-10-CM | POA: Insufficient documentation

## 2022-01-03 DIAGNOSIS — D72829 Elevated white blood cell count, unspecified: Secondary | ICD-10-CM | POA: Diagnosis not present

## 2022-01-03 DIAGNOSIS — R112 Nausea with vomiting, unspecified: Secondary | ICD-10-CM

## 2022-01-03 DIAGNOSIS — Z87442 Personal history of urinary calculi: Secondary | ICD-10-CM | POA: Diagnosis not present

## 2022-01-03 DIAGNOSIS — Z7982 Long term (current) use of aspirin: Secondary | ICD-10-CM | POA: Diagnosis not present

## 2022-01-03 DIAGNOSIS — N3000 Acute cystitis without hematuria: Secondary | ICD-10-CM

## 2022-01-03 DIAGNOSIS — R197 Diarrhea, unspecified: Secondary | ICD-10-CM | POA: Diagnosis not present

## 2022-01-03 DIAGNOSIS — K209 Esophagitis, unspecified without bleeding: Secondary | ICD-10-CM | POA: Diagnosis not present

## 2022-01-03 DIAGNOSIS — R42 Dizziness and giddiness: Secondary | ICD-10-CM | POA: Insufficient documentation

## 2022-01-03 DIAGNOSIS — R1013 Epigastric pain: Secondary | ICD-10-CM | POA: Diagnosis present

## 2022-01-03 DIAGNOSIS — E119 Type 2 diabetes mellitus without complications: Secondary | ICD-10-CM | POA: Insufficient documentation

## 2022-01-03 DIAGNOSIS — R519 Headache, unspecified: Secondary | ICD-10-CM | POA: Diagnosis not present

## 2022-01-03 DIAGNOSIS — Z8543 Personal history of malignant neoplasm of ovary: Secondary | ICD-10-CM | POA: Diagnosis not present

## 2022-01-03 DIAGNOSIS — Z7984 Long term (current) use of oral hypoglycemic drugs: Secondary | ICD-10-CM | POA: Insufficient documentation

## 2022-01-03 LAB — COMPREHENSIVE METABOLIC PANEL
ALT: 25 U/L (ref 0–44)
AST: 46 U/L — ABNORMAL HIGH (ref 15–41)
Albumin: 4.1 g/dL (ref 3.5–5.0)
Alkaline Phosphatase: 46 U/L (ref 38–126)
Anion gap: 11 (ref 5–15)
BUN: 21 mg/dL — ABNORMAL HIGH (ref 6–20)
CO2: 23 mmol/L (ref 22–32)
Calcium: 8.8 mg/dL — ABNORMAL LOW (ref 8.9–10.3)
Chloride: 103 mmol/L (ref 98–111)
Creatinine, Ser: 1.16 mg/dL — ABNORMAL HIGH (ref 0.44–1.00)
GFR, Estimated: 57 mL/min — ABNORMAL LOW (ref >60)
Glucose, Bld: 131 mg/dL — ABNORMAL HIGH (ref 70–99)
Potassium: 4.7 mmol/L (ref 3.5–5.1)
Sodium: 137 mmol/L (ref 135–145)
Total Bilirubin: 3.1 mg/dL — ABNORMAL HIGH (ref 0.3–1.2)
Total Protein: 7.2 g/dL (ref 6.5–8.1)

## 2022-01-03 LAB — URINALYSIS, ROUTINE W REFLEX MICROSCOPIC
Glucose, UA: NEGATIVE mg/dL
Ketones, ur: 15 mg/dL — AB
Nitrite: NEGATIVE
Protein, ur: NEGATIVE mg/dL
Specific Gravity, Urine: 1.02 (ref 1.005–1.030)
pH: 6 (ref 5.0–8.0)

## 2022-01-03 LAB — CBC
HCT: 46.8 % — ABNORMAL HIGH (ref 36.0–46.0)
Hemoglobin: 16.6 g/dL — ABNORMAL HIGH (ref 12.0–15.0)
MCH: 30.4 pg (ref 26.0–34.0)
MCHC: 35.5 g/dL (ref 30.0–36.0)
MCV: 85.7 fL (ref 80.0–100.0)
Platelets: 262 10*3/uL (ref 150–400)
RBC: 5.46 MIL/uL — ABNORMAL HIGH (ref 3.87–5.11)
RDW: 13.3 % (ref 11.5–15.5)
WBC: 16.9 10*3/uL — ABNORMAL HIGH (ref 4.0–10.5)
nRBC: 0 % (ref 0.0–0.2)

## 2022-01-03 LAB — URINALYSIS, MICROSCOPIC (REFLEX)

## 2022-01-03 LAB — TROPONIN I (HIGH SENSITIVITY)
Troponin I (High Sensitivity): 3 ng/L (ref <18)
Troponin I (High Sensitivity): 3 ng/L (ref <18)

## 2022-01-03 LAB — LACTIC ACID, PLASMA: Lactic Acid, Venous: 0.9 mmol/L (ref 0.5–1.9)

## 2022-01-03 LAB — LIPASE, BLOOD: Lipase: 39 U/L (ref 11–51)

## 2022-01-03 MED ORDER — OMEPRAZOLE 20 MG PO CPDR: 20.0000 mg | DELAYED_RELEASE_CAPSULE | Freq: Every day | ORAL | 0 refills | Status: AC

## 2022-01-03 MED ORDER — IOHEXOL 350 MG/ML SOLN
100.0000 mL | Freq: Once | INTRAVENOUS | Status: AC | PRN
Start: 2022-01-03 — End: 2022-01-03
  Administered 2022-01-03: 100 mL via INTRAVENOUS

## 2022-01-03 MED ORDER — PROCHLORPERAZINE EDISYLATE 10 MG/2ML IJ SOLN
10.0000 mg | Freq: Once | INTRAMUSCULAR | Status: AC
  Administered 2022-01-03: 10 mg via INTRAVENOUS
  Filled 2022-01-03: qty 2

## 2022-01-03 MED ORDER — IOHEXOL 350 MG/ML SOLN
80.0000 mL | Freq: Once | INTRAVENOUS | Status: AC | PRN
  Administered 2022-01-03: 80 mL via INTRAVENOUS

## 2022-01-03 MED ORDER — SUCRALFATE 1 G PO TABS: 1.0000 g | ORAL_TABLET | Freq: Three times a day (TID) | ORAL | 0 refills | Status: AC

## 2022-01-03 MED ORDER — PROCHLORPERAZINE MALEATE 10 MG PO TABS: 10.0000 mg | ORAL_TABLET | Freq: Two times a day (BID) | ORAL | 0 refills | Status: AC | PRN

## 2022-01-03 MED ORDER — CEPHALEXIN 500 MG PO CAPS: 500.0000 mg | ORAL_CAPSULE | Freq: Two times a day (BID) | ORAL | 0 refills | Status: AC

## 2022-01-03 MED ORDER — SODIUM CHLORIDE 0.9 % IV BOLUS
1000.0000 mL | Freq: Once | INTRAVENOUS | Status: AC
  Administered 2022-01-03: 1000 mL via INTRAVENOUS

## 2022-01-03 NOTE — ED Triage Notes (Signed)
C/o epigastric pain, nausea/vomiting, diarrhea since Friday. Pain worse after eating. Dizziness x 1 month, worse the past few days.  ?

## 2022-01-03 NOTE — ED Notes (Signed)
Patient transported to CT 

## 2022-01-03 NOTE — ED Provider Notes (Signed)
?[facility] ?Provider Note ? ? ?CSN: [id number] ?Arrival date & time: 01/03/22  1429 ? ?  ? ?History ? ?Chief Complaint  ?Patient presents with  ? Abdominal Pain  ? ? ?Catherine Marshall is a 51 y.o. female. ? ?The history is provided by the patient and medical records. No language interpreter was used.  ?Abdominal Pain ?Pain location:  Epigastric and RLQ ?Pain quality: aching, cramping and gnawing   ?Pain radiates to:  Does not radiate ?Pain severity:  Severe ?Onset quality:  Gradual ?Duration:  1 week ?Timing:  Intermittent ?Progression:  Waxing and waning ?Chronicity:  New ?Context: eating, previous surgery and recent illness   ?Context: not trauma   ?Relieved by:  Nothing ?Worsened by:  Eating and palpation ?Ineffective treatments:  None tried ?Associated symptoms: diarrhea, dysuria, nausea and vomiting   ?Associated symptoms: no chest pain, no chills, no constipation, no cough, no fatigue, no fever, no hematuria, no shortness of breath, no vaginal bleeding and no vaginal discharge   ?Risk factors: multiple surgeries   ? ?  ? ?Home Medications ?Prior to Admission medications   ?Medication Sig Start Date End Date Taking? Authorizing Provider  ?aspirin 81 MG tablet Take 81 mg by mouth daily.    [provider]  ?azithromycin (ZITHROMAX Z-PAK) 250 MG tablet Take two pills today followed by one a day until gone 06/27/21   Raylene Everts, MD  ?benzonatate (TESSALON) 200 MG capsule Take 1 capsule (200 mg total) by mouth 2 (two) times daily as needed for cough. 06/27/21   Raylene Everts, MD  ?buPROPion (WELLBUTRIN XL) 150 MG 24 hr tablet Take 150 mg by mouth daily.    [provider]  ?citalopram (CELEXA) 20 MG tablet Take 20 mg by mouth daily.    [provider]  ?cyclobenzaprine (FLEXERIL) 10 MG tablet Take 1 tablet (10 mg total) by mouth at bedtime. For muscle relaxant 06/29/19   Jacqulyn Cane, MD  ?ezetimibe (ZETIA) 10 MG tablet Take 10 mg by mouth daily.     [provider]  ?fluticasone (FLONASE) 50 MCG/ACT nasal spray Place 2 sprays into both nostrils daily. 06/27/21   Raylene Everts, MD  ?insulin lispro protamine-lispro (HUMALOG 75/25 MIX) (75-25) 100 UNIT/ML SUSP injection Inject into the skin.    [provider]  ?lisinopril (PRINIVIL,ZESTRIL) 5 MG tablet Take 10 mg by mouth daily.     [provider]  ?meloxicam (MOBIC) 7.5 MG tablet Take 1 twice a day as needed for pain. Take with food. (Do not take with any other NSAID.) 06/29/19   Jacqulyn Cane, MD  ?metFORMIN (GLUCOPHAGE) 500 MG tablet Take by mouth 2 (two) times daily with a meal.    [provider]  ?methenamine (HIPREX) 1 g tablet Take 1 g by mouth 2 (two) times daily with a meal.    [provider]  ?mupirocin nasal ointment (BACTROBAN) 2 % Apply in each nostril daily 12/01/19   Wardell Honour, MD  ?naproxen (NAPROSYN) 500 MG tablet Take 1 tablet (500 mg total) by mouth 2 (two) times daily. 06/24/18   Petrucelli, Samantha R, PA-C  ?potassium citrate (UROCIT-K) 10 MEQ (1080 MG) SR tablet Take 10 mEq by mouth 3 (three) times daily with meals.    [provider]  ?predniSONE (DELTASONE) 20 MG tablet Take one tab by mouth twice daily for 4 days, then one daily for 3 days. Take with food. 12/11/20   Kandra Nicolas, MD  ?Semaglutide (  OZEMPIC, 1 MG/DOSE, [place]) Inject 1 Units into the skin once a week.    [provider]  ?SUMAtriptan (IMITREX) 100 MG tablet Take by mouth. 11/22/17 11/23/18  [provider]  ?   ? ?Allergies    ?Sulfa antibiotics, Bactrim [sulfamethoxazole-trimethoprim], Caffeine, and Morphine and related   ? ?Review of Systems   ?Review of Systems  ?Constitutional:  Positive for unexpected weight change. Negative for chills, diaphoresis, fatigue and fever.  ?HENT:  Negative for congestion.   ?Eyes:  Negative for visual disturbance.  ?Respiratory:  Negative for cough, chest tightness, shortness of breath and wheezing.    ?Cardiovascular:  Negative for chest pain, palpitations and leg swelling.  ?Gastrointestinal:  Positive for abdominal pain, diarrhea, nausea and vomiting. Negative for abdominal distention and constipation.  ?Genitourinary:  Positive for dysuria. Negative for flank pain, hematuria, vaginal bleeding and vaginal discharge.  ?Musculoskeletal:  Negative for back pain, neck pain and neck stiffness.  ?Skin:  Negative for wound.  ?Neurological:  Positive for dizziness, light-headedness and headaches. Negative for weakness and numbness.  ?Psychiatric/Behavioral:  Negative for agitation and confusion.   ?All other systems reviewed and are negative. ? ?Physical Exam ?Updated Vital Signs ?BP (!) 152/84 (BP Location: Right Arm)   Pulse 96   Temp 98 ?F (36.7 ?C) (Oral)   Resp 17   Ht '5\' 5"'$  (1.651 m)   Wt 93.9 kg   LMP 08/03/2016   SpO2 98%   BMI 34.45 kg/m?  ?Physical Exam ?Vitals and nursing note reviewed.  ?Constitutional:   ?   General: She is not in acute distress. ?   Appearance: She is well-developed. She is not ill-appearing, toxic-appearing or diaphoretic.  ?HENT:  ?   Head: Normocephalic and atraumatic.  ?   Nose: No congestion or rhinorrhea.  ?   Mouth/Throat:  ?   Mouth: Mucous membranes are dry.  ?   Pharynx: No oropharyngeal exudate or posterior oropharyngeal erythema.  ?Eyes:  ?   Extraocular Movements: Extraocular movements intact.  ?   Conjunctiva/sclera: Conjunctivae normal.  ?   Pupils: Pupils are equal, round, and reactive to light.  ?Neck:  ?   Vascular: No carotid bruit.  ?Cardiovascular:  ?   Rate and Rhythm: Normal rate and regular rhythm.  ?   Heart sounds: No murmur heard. ?Pulmonary:  ?   Effort: Pulmonary effort is normal. No respiratory distress.  ?   Breath sounds: Normal breath sounds. No wheezing, rhonchi or rales.  ?Chest:  ?   Chest wall: No tenderness.  ?Abdominal:  ?   General: Abdomen is flat.  ?   Palpations: Abdomen is soft.  ?   Tenderness: There is abdominal tenderness. There is  no right CVA tenderness, left CVA tenderness, guarding or rebound.  ?Musculoskeletal:     ?   General: No swelling or tenderness.  ?   Cervical back: Neck supple. No rigidity or tenderness.  ?   Left lower leg: No edema.  ?Skin: ?   General: Skin is warm and dry.  ?   Capillary Refill: Capillary refill takes less than 2 seconds.  ?   Findings: No erythema.  ?Neurological:  ?   General: No focal deficit present.  ?   Mental Status: She is alert and oriented to person, place, and time.  ?   Sensory: No sensory deficit.  ?   Motor: No weakness.  ?Psychiatric:     ?   Mood and Affect: Mood normal.  ? ? ?  ED Results / Procedures / Treatments   ?Labs ?(all labs ordered are listed, but only abnormal results are displayed) ?Labs Reviewed  ?COMPREHENSIVE METABOLIC PANEL - Abnormal; Notable for the following components:  ?    Result Value  ? Glucose, Bld 131 (*)   ? BUN 21 (*)   ? Creatinine, Ser 1.16 (*)   ? Calcium 8.8 (*)   ? AST 46 (*)   ? Total Bilirubin 3.1 (*)   ? GFR, Estimated 57 (*)   ? All other components within normal limits  ?CBC - Abnormal; Notable for the following components:  ? WBC 16.9 (*)   ? RBC 5.46 (*)   ? Hemoglobin 16.6 (*)   ? HCT 46.8 (*)   ? All other components within normal limits  ?URINALYSIS, ROUTINE W REFLEX MICROSCOPIC - Abnormal; Notable for the following components:  ? APPearance CLOUDY (*)   ? Hgb urine dipstick TRACE (*)   ? Bilirubin Urine SMALL (*)   ? Ketones, ur 15 (*)   ? Leukocytes,Ua SMALL (*)   ? All other components within normal limits  ?URINALYSIS, MICROSCOPIC (REFLEX) - Abnormal; Notable for the following components:  ? Bacteria, UA FEW (*)   ? All other components within normal limits  ?URINE CULTURE  ?LIPASE, BLOOD  ?LACTIC ACID, PLASMA  ?LACTIC ACID, PLASMA  ?TROPONIN I (HIGH SENSITIVITY)  ?TROPONIN I (HIGH SENSITIVITY)  ? ? ?EKG ?EKG Interpretation ? ?Date/Time:  Monday January 03 2022 14:40:56 EDT ?Ventricular Rate:  99 ?PR Interval:  122 ?QRS Duration: 92 ?QT  Interval:  376 ?QTC Calculation: 482 ?R Axis:   74 ?Text Interpretation: Normal sinus rhythm Possible Anterior infarct , age undetermined Abnormal ECG When compared with ECG of 14-Feb-2021 17:19, PREVIOUS ECG IS PRESENT when compar

## 2022-01-03 NOTE — Discharge Instructions (Signed)
Your history, exam, work-up today led [place] to do labs and imaging to rule out concerning causes of all of your symptoms.  The CT imaging of your head and neck were overall reassuring as we discussed.  Please follow-up with ear nose and throat for further dizziness troubles.  The abdomen pelvis CT did show the irritation of the esophagus called esophagitis.  Please take the nausea medicine  to maintain hydration and take medications and also take the Prilosec and Carafate to help.  Please follow-up with gastroenterology to discuss further evaluation and management.  Please rest and stay hydrated and follow-up with your primary doctor as well.  If any symptoms change or worsen acutely, please return to the nearest emergency department. ?

## 2022-01-04 LAB — URINE CULTURE: Culture: NO GROWTH
